# Patient Record
Sex: Male | Born: 1950
Health system: Southern US, Community
[De-identification: ages and names within clinical notes are randomized; demographics above are authoritative.]

## PROBLEM LIST (undated history)

## (undated) DIAGNOSIS — K219 Gastro-esophageal reflux disease without esophagitis: Secondary | ICD-10-CM

## (undated) DIAGNOSIS — I10 Essential (primary) hypertension: Secondary | ICD-10-CM

## (undated) DIAGNOSIS — E118 Type 2 diabetes mellitus with unspecified complications: Secondary | ICD-10-CM

## (undated) HISTORY — DX: Type 2 diabetes mellitus with unspecified complications: E11.8

---

## 2003-05-13 ENCOUNTER — Encounter: Admission: RE | Admit: 2003-05-13 | Discharge: 2003-05-13 | Payer: Self-pay | Admitting: Family Medicine

## 2003-09-27 ENCOUNTER — Encounter: Admission: RE | Admit: 2003-09-27 | Discharge: 2003-09-27 | Payer: Self-pay | Admitting: Family Medicine

## 2010-01-28 ENCOUNTER — Encounter: Payer: Self-pay | Admitting: Family Medicine

## 2012-10-12 ENCOUNTER — Ambulatory Visit (INDEPENDENT_AMBULATORY_CARE_PROVIDER_SITE_OTHER): Payer: BC Managed Care – PPO | Admitting: Physician Assistant

## 2012-10-12 ENCOUNTER — Encounter: Payer: Self-pay | Admitting: Physician Assistant

## 2012-10-12 VITALS — BP 126/88 | HR 80 | Temp 98.3°F | Resp 18 | Ht 64.0 in | Wt 166.0 lb

## 2012-10-12 DIAGNOSIS — L03011 Cellulitis of right finger: Secondary | ICD-10-CM

## 2012-10-12 DIAGNOSIS — IMO0002 Reserved for concepts with insufficient information to code with codable children: Secondary | ICD-10-CM

## 2012-10-12 MED ORDER — HYDROCODONE-ACETAMINOPHEN 5-325 MG PO TABS: 1.0000 | ORAL_TABLET | Freq: Four times a day (QID) | ORAL | Status: DC | PRN

## 2012-10-12 MED ORDER — DICLOXACILLIN SODIUM 500 MG PO CAPS: 500.0000 mg | ORAL_CAPSULE | Freq: Four times a day (QID) | ORAL | Status: DC

## 2012-10-12 NOTE — Progress Notes (Signed)
Patient ID: Cristian Austin MRN: [id number], DOB: [birth year], 62 y.o. Date of Encounter: 10/12/2012, 4:13 PM    Chief Complaint:  Chief Complaint  Patient presents with  . infection rt thumb     HPI: 62 y.o. year old AA male is the husband of one of my patients Cristian Austin. He states that about one week ago he been trimming some hedges but did not notice getting any pricks to his scan at that time. However it was around that same time he noticed some pain in his right thumb. However at that time it looked normal on inspection. The following day he really didn't notice much pain. However since then he has been developing some throbbing type pain and changes in the skin around the nail of the right thumb.  He reports he has never had any skin infections including any abscess/boil. And again he is unaware of any pricks or wounds to the skin.     Home Meds: See attached medication section for any medications that were entered at today's visit. The computer does not put those onto this list.The following list is a list of meds entered prior to today's visit.   No current outpatient prescriptions on file prior to visit.   No current facility-administered medications on file prior to visit.    Allergies:  Allergies  Allergen Reactions  . Asa [Aspirin]     GI upset      Review of Systems: See HPI for pertinent ROS. All other ROS negative.    Physical Exam: Blood pressure 126/88, pulse 80, temperature 98.3 F (36.8 C), temperature source Oral, resp. rate 18, height 5\' 4"  (1.626 m), weight 166 lb (75.297 kg)., Body mass index is 28.48 kg/(m^2). General: Well-nourished well-developed after American male. Appears in no acute distress. Lungs: Clear bilaterally to auscultation without wheezes, rales, or rhonchi. Breathing is unlabored. Heart: Regular rhythm. No murmurs, rubs, or gallops. Msk:  Strength and tone normal for age. Extremities/Skin: Right thumb: Along the medial edge of the  nail, there is approximate 3 mm area of golden color under medial edge of the nail. Adjacent to this, at the level of the skin along the medial edge of the nail, there is approx 3mm area of golden color just below the skin surface. Around the periphery of this, is an approximate 3 mm area of white coloration and is slightly blistered up. Neuro: Alert and oriented X 3. Moves all extremities spontaneously. Gait is normal. CNII-XII grossly in tact. Psych:  Responds to questions appropriately with a normal affect.     ASSESSMENT AND PLAN:  62 y.o. year old male with  1. Paronychia, right The area of the skin just next to the medial edge of the nail, I performed a very superficial incision with a sharp scalpel. Thick purulent drainage was obtained and sent for culture. I then palpated the area, getting out all of the purulent drainage. Start Empiric antibiotics of dicloxacillin. We'll give medication for pain. We'll followup culture results. Keep the site clean and dry. Followup if worsens or does not resolve or if develops fever/chills . - dicloxacillin (DYNAPEN) 500 MG capsule; Take 1 capsule (500 mg total) by mouth 4 (four) times daily.  Dispense: 40 capsule; Refill: 0 - HYDROcodone-acetaminophen (NORCO/VICODIN) 5-325 MG per tablet; Take 1 tablet by mouth every 6 (six) hours as needed for pain.  Dispense: 30 tablet; Refill: 0 - Culture, routine-abscess   Signed, [address] [place], [place], [facility] 10/12/2012 4:13 PM

## 2012-10-13 ENCOUNTER — Telehealth: Payer: Self-pay | Admitting: Family Medicine

## 2012-10-13 NOTE — Telephone Encounter (Signed)
Switch patient to bactrim ds po bid to cover mrsa for 10 days.

## 2012-10-13 NOTE — Telephone Encounter (Signed)
Patient was prescribed an antibiotic yesterday and went to fill it and they told her that they didn't have it and he needs it and she said that they called everywhere and no one has it in stock?

## 2012-10-13 NOTE — Telephone Encounter (Signed)
Called pt and the pharmacy got the original antibiotic in today around 12 so he does not need the Bactrim.

## 2012-10-16 LAB — CULTURE, ROUTINE-ABSCESS

## 2012-10-21 ENCOUNTER — Telehealth: Payer: Self-pay | Admitting: Physician Assistant

## 2012-10-21 NOTE — Telephone Encounter (Signed)
Patient came in with an infection to his Lt Thumb. Patient called in today to find out if we could see him tomorrow. All we have is Same day appointment. His thumb is still somewhat infected. He has pill left. Dicloxacillin 500 mg.

## 2012-10-22 NOTE — Telephone Encounter (Signed)
I just got a chance to see this message. Once I did read it, I called the patient at the phone number listed on the message which is his home number and also called what is listed in the computer as his work number. These are the only numbers that we have in his file. I was unable to contact the patient. I was going to followup with him to find out exactly how much antibiotic he has remaining and find out exactly how much his finger has improved.I will  Continue to try to contact patient.

## 2012-10-22 NOTE — Telephone Encounter (Signed)
Pt has two pills left.  Has puss pocket forming again under nail.  NTBS.  Told to come in tomorrow

## 2012-10-23 ENCOUNTER — Encounter: Payer: Self-pay | Admitting: Family Medicine

## 2012-10-23 ENCOUNTER — Ambulatory Visit (INDEPENDENT_AMBULATORY_CARE_PROVIDER_SITE_OTHER): Payer: BC Managed Care – PPO | Admitting: Family Medicine

## 2012-10-23 VITALS — BP 114/78 | HR 68 | Temp 98.1°F | Resp 18 | Wt 162.0 lb

## 2012-10-23 DIAGNOSIS — L03019 Cellulitis of unspecified finger: Secondary | ICD-10-CM

## 2012-10-23 DIAGNOSIS — L03011 Cellulitis of right finger: Secondary | ICD-10-CM

## 2012-10-23 MED ORDER — SULFAMETHOXAZOLE-TMP DS 800-160 MG PO TABS: 1.0000 | ORAL_TABLET | Freq: Two times a day (BID) | ORAL | Status: DC

## 2012-10-23 NOTE — Progress Notes (Signed)
  Subjective:    Patient ID: Cristian Austin, male    DOB: Jan 07, 1951, 62 y.o.   MRN: [id number]  HPI Was recently diagnosed with paronychia on his right thumb on the lateral margin of the fingernail.  Patient was started appropriately on dicloxacillin. After soaking his finger in warm Epson salt it drained purulent material from the perineal yet and from underneath the fingernail. There is no more fluctuance. There is no fluid collection to be drained today. The area is still erythematous and tender. There is mild desquamation of the overlying skin.  He has 2 children who have a history of MRSA infection and boils. No past medical history on file. Current Outpatient Prescriptions on File Prior to Visit  Medication Sig Dispense Refill  . omeprazole (PRILOSEC OTC) 20 MG tablet Take 20 mg by mouth daily.      . dicloxacillin (DYNAPEN) 500 MG capsule Take 1 capsule (500 mg total) by mouth 4 (four) times daily.  40 capsule  0  . HYDROcodone-acetaminophen (NORCO/VICODIN) 5-325 MG per tablet Take 1 tablet by mouth every 6 (six) hours as needed for pain.  30 tablet  0   No current facility-administered medications on file prior to visit.   Allergies  Allergen Reactions  . Asa [Aspirin]     GI upset   History   Social History  . Marital Status: Single    Spouse Name: N/A    Number of Children: N/A  . Years of Education: N/A   Occupational History  . Not on file.   Social History Main Topics  . Smoking status: Former Smoker    Quit date: 10/12/1997  . Smokeless tobacco: Never Used  . Alcohol Use: No  . Drug Use: No  . Sexual Activity: Not on file   Other Topics Concern  . Not on file   Social History Narrative  . No narrative on file      Review of Systems  All other systems reviewed and are negative.       Objective:   Physical Exam  Vitals reviewed. Cardiovascular: Normal rate and regular rhythm.   Pulmonary/Chest: Effort normal and breath sounds normal.  Skin: There  is erythema.   right thumb lateral nail margin and surrounding skin is erythematous and tender. It has spontaneously drained purulent material. There is no more fluid to be drained. There is no fluid available for collection and culture.        Assessment & Plan:  1. Paronychia of finger, right I switched the patient to Bactrim double strength 1 tablet by mouth twice a day for 7 days to cover MRSA. I recommended continuing to soak daily in warm Epsom salts. Recheck in 1 week if no better or sooner if worse. - sulfamethoxazole-trimethoprim (BACTRIM DS) 800-160 MG per tablet; Take 1 tablet by mouth 2 (two) times daily.  Dispense: 14 tablet; Refill: 0

## 2013-01-22 ENCOUNTER — Ambulatory Visit (INDEPENDENT_AMBULATORY_CARE_PROVIDER_SITE_OTHER): Payer: BC Managed Care – PPO | Admitting: Family Medicine

## 2013-01-22 ENCOUNTER — Encounter: Payer: Self-pay | Admitting: Family Medicine

## 2013-01-22 VITALS — BP 142/90 | HR 80 | Temp 97.6°F | Resp 18 | Ht 65.0 in | Wt 172.0 lb

## 2013-01-22 DIAGNOSIS — K611 Rectal abscess: Secondary | ICD-10-CM

## 2013-01-22 DIAGNOSIS — K612 Anorectal abscess: Secondary | ICD-10-CM

## 2013-01-22 MED ORDER — SULFAMETHOXAZOLE-TMP DS 800-160 MG PO TABS: 1.0000 | ORAL_TABLET | Freq: Two times a day (BID) | ORAL | Status: DC

## 2013-01-22 NOTE — Progress Notes (Signed)
   Subjective:    Patient ID: Cristian Austin, male    DOB: 1950/03/23, 63 y.o.   MRN: [id number]  HPI  Patient has had a boil near his rectum one-week.  The area is approximately 1 cm is erythematous tender and swollen.  Fortunately it is not fluctuant. It appears to be more of an indurated cellulitis than abscess. He is here today for medical therapy. No past medical history on file. Current Outpatient Prescriptions on File Prior to Visit  Medication Sig Dispense Refill  . omeprazole (PRILOSEC OTC) 20 MG tablet Take 20 mg by mouth daily.       No current facility-administered medications on file prior to visit.   Allergies  Allergen Reactions  . Asa [Aspirin]     GI upset   History   Social History  . Marital Status: Single    Spouse Name: N/A    Number of Children: N/A  . Years of Education: N/A   Occupational History  . Not on file.   Social History Main Topics  . Smoking status: Former Smoker    Quit date: 10/12/1997  . Smokeless tobacco: Never Used  . Alcohol Use: No  . Drug Use: No  . Sexual Activity: Not on file   Other Topics Concern  . Not on file   Social History Narrative  . No narrative on file     Review of Systems  All other systems reviewed and are negative.       Objective:   Physical Exam  Vitals reviewed. Cardiovascular: Normal rate and regular rhythm.   Pulmonary/Chest: Effort normal and breath sounds normal.  Skin: Skin is warm. There is erythema.   neorectum on his left gluteus there is a 1 cm erythematous tender indurated area. It is not fluctuant. There is no appreciable fluid that can be drained through an I+D.        Assessment & Plan:  1. Perirectal abscess Begin Bactrim double strength one by mouth twice a day for 7 days. I recommended soaking in Epsom salts twice a day. If the area becomes fluctuant or worsens he needs to return immediately for an I&D.  I did not want to perform an I&D today he 2 reasons. One there is not an  appreciable collection of fluid that they would benefit from incision and drainage. #2 the area is in such a bad area that I think it would be susceptible to superinfection. - sulfamethoxazole-trimethoprim (BACTRIM DS) 800-160 MG per tablet; Take 1 tablet by mouth 2 (two) times daily.  Dispense: 14 tablet; Refill: 0

## 2013-03-30 ENCOUNTER — Encounter: Payer: Self-pay | Admitting: Family Medicine

## 2013-03-30 ENCOUNTER — Ambulatory Visit (INDEPENDENT_AMBULATORY_CARE_PROVIDER_SITE_OTHER): Payer: BC Managed Care – PPO | Admitting: Family Medicine

## 2013-03-30 VITALS — BP 130/84 | HR 84 | Temp 98.5°F | Resp 18 | Ht 65.0 in | Wt 173.0 lb

## 2013-03-30 DIAGNOSIS — K219 Gastro-esophageal reflux disease without esophagitis: Secondary | ICD-10-CM

## 2013-03-30 MED ORDER — OMEPRAZOLE 40 MG PO CPDR: 40.0000 mg | DELAYED_RELEASE_CAPSULE | Freq: Every day | ORAL | Status: DC

## 2013-03-30 MED ORDER — CELECOXIB 200 MG PO CAPS: 200.0000 mg | ORAL_CAPSULE | Freq: Two times a day (BID) | ORAL | Status: DC

## 2013-03-30 NOTE — Progress Notes (Signed)
   Subjective:    Patient ID: Pricilla Larssononald Kirn, male    DOB: December 13, 1950, 63 y.o.   MRN: [id number]  HPI Patient reports indigestion. On a daily basis he has a burning sensation starting in his stomach and radiate up underneath the sternum into the back of his throat.  It is worse when he eats spicy foods or when he lies down. He denies any melena or hematochezia. He denies any chest pain or shortness of breath or angina. He denies any weight loss fevers or chills. Symptoms improved on over-the-counter omeprazole.  Symptoms have been present for several weeks. He is also having pain in both shoulders with overhead activities. He is interested in taking anti-inflammatory drugs for this. No past medical history on file. Current Outpatient Prescriptions on File Prior to Visit  Medication Sig Dispense Refill  . omeprazole (PRILOSEC OTC) 20 MG tablet Take 20 mg by mouth daily.       No current facility-administered medications on file prior to visit.   Allergies  Allergen Reactions  . Asa [Aspirin]     GI upset   History   Social History  . Marital Status: Single    Spouse Name: N/A    Number of Children: N/A  . Years of Education: N/A   Occupational History  . Not on file.   Social History Main Topics  . Smoking status: Former Smoker    Quit date: 10/12/1997  . Smokeless tobacco: Never Used  . Alcohol Use: No  . Drug Use: No  . Sexual Activity: Not on file   Other Topics Concern  . Not on file   Social History Narrative  . No narrative on file      Review of Systems  All other systems reviewed and are negative.       Objective:   Physical Exam  Vitals reviewed. Cardiovascular: Normal rate, regular rhythm and normal heart sounds.  Exam reveals no gallop and no friction rub.   No murmur heard. Pulmonary/Chest: Effort normal and breath sounds normal. No respiratory distress. He has no wheezes. He has no rales.  Abdominal: Soft. Bowel sounds are normal. He exhibits no  distension. There is no tenderness. There is no rebound and no guarding.          Assessment & Plan:  1. GERD (gastroesophageal reflux disease) Begin omeprazole 40 mg by mouth daily. Given the patient's history of GI upset on aspirin I recommended that he not take any anti-inflammatories at the present time for his shoulder pain until his stomach problems improve on the omeprazole.  Afterwards the patient can begin Celebrex 200 mg by mouth daily when necessary. - omeprazole (PRILOSEC) 40 MG capsule; Take 1 capsule (40 mg total) by mouth daily.  Dispense: 90 capsule; Refill: 3

## 2013-05-26 ENCOUNTER — Ambulatory Visit (INDEPENDENT_AMBULATORY_CARE_PROVIDER_SITE_OTHER): Payer: BC Managed Care – PPO | Admitting: Family Medicine

## 2013-05-26 ENCOUNTER — Encounter: Payer: Self-pay | Admitting: Family Medicine

## 2013-05-26 VITALS — BP 136/82 | HR 64 | Temp 97.3°F | Resp 14 | Ht 67.0 in | Wt 171.0 lb

## 2013-05-26 DIAGNOSIS — L02419 Cutaneous abscess of limb, unspecified: Secondary | ICD-10-CM

## 2013-05-26 DIAGNOSIS — L03119 Cellulitis of unspecified part of limb: Principal | ICD-10-CM

## 2013-05-26 MED ORDER — SULFAMETHOXAZOLE-TMP DS 800-160 MG PO TABS: 1.0000 | ORAL_TABLET | Freq: Two times a day (BID) | ORAL | Status: DC

## 2013-05-26 MED ORDER — OMEPRAZOLE 20 MG PO CPDR: 20.0000 mg | DELAYED_RELEASE_CAPSULE | Freq: Every day | ORAL | Status: DC

## 2013-05-26 NOTE — Progress Notes (Signed)
Patient ID: Cristian LarssonRonald Austin, male   DOB: 1950-12-20, 63 y.o.   MRN: [id number]   Subjective:    Patient ID: Cristian Larssononald Suchy, male    DOB: 1950-12-20, 63 y.o.   MRN: [id number]  Patient presents for hard knot to R inner knee  patient here with swelling on his right inner knee for the past week. States it started out as a small little bump he tries to squeeze it he did get a little bit of clear fluid out there was tenderness all around and some redness but now he just has a tender spot in the middle and scab. He denies any actual knee pain no fever no chills. No over-the-counter medications taken.  He also requests a refill of his Prilosec 20 mg    Review Of Systems:  GEN- denies fatigue, fever, weight loss,weakness, recent illness HEENT- denies eye drainage, change in vision, nasal discharge, CVS- denies chest pain, palpitations RESP- denies SOB, cough, wheeze MSK- denies joint pain, muscle aches, injury        Objective:    BP 136/82  Pulse 64  Temp(Src) 97.3 F (36.3 C) (Oral)  Resp 14  Ht 5\' 7"  (1.702 m)  Wt 171 lb (77.565 kg)  BMI 26.78 kg/m2 GEN- NAD, alert and oriented x3 MSK- right knee normal inspection , FROM, no crepitus, no effusion, no warmth Skin- small abscess size of quarter 1cm below patella and medially, mild erythema surrounding, fluctuant in center, mild TTP       Assessment & Plan:      Problem List Items Addressed This Visit   None    Visit Diagnoses   Cellulitis and abscess of leg    -  Primary    Small boil on his leg with minimal cellulitic changes. We discussed incision and drainage however he declined today as he has to work the next 2 days. Start Bactrim, f/u Friday if not improved and I and D will be done       Note: This dictation was prepared with Dragon dictation along with smaller phrase technology. Any transcriptional errors that result from this process are unintentional.

## 2013-05-26 NOTE — Patient Instructions (Signed)
Use warm compresses on knee Start antibiotics Come back Friday if not better for recheck

## 2014-03-25 ENCOUNTER — Other Ambulatory Visit: Payer: Self-pay | Admitting: Family Medicine

## 2014-03-25 NOTE — Telephone Encounter (Signed)
Refill appropriate and filled per protocol. 

## 2014-09-02 ENCOUNTER — Encounter: Payer: Self-pay | Admitting: Family Medicine

## 2014-09-02 ENCOUNTER — Ambulatory Visit (INDEPENDENT_AMBULATORY_CARE_PROVIDER_SITE_OTHER): Payer: BC Managed Care – PPO | Admitting: Family Medicine

## 2014-09-02 VITALS — BP 130/90 | HR 86 | Temp 98.2°F | Resp 16 | Ht 65.0 in | Wt 173.0 lb

## 2014-09-02 DIAGNOSIS — J069 Acute upper respiratory infection, unspecified: Secondary | ICD-10-CM | POA: Diagnosis not present

## 2014-09-02 MED ORDER — GUAIFENESIN-CODEINE 100-10 MG/5ML PO SOLN: 10.0000 mL | Freq: Four times a day (QID) | ORAL | Status: DC | PRN

## 2014-09-02 NOTE — Progress Notes (Signed)
   Subjective:    Patient ID: Cristian Austin, male    DOB: Feb 03, 1950, 64 y.o.   MRN: [id number]  HPI  Patient reports a one-week history of cough. He acquired the infection from his coworker. He denies any fever. He denies any shortness of breath. He denies any chest pain or pleurisy. He denies any hemoptysis. The cough is productive of clear sputum. He denies any rhinorrhea or sore throat. The cough is mainly a tickle sensation in the back of his throat. No past medical history on file. No past surgical history on file. Current Outpatient Prescriptions on File Prior to Visit  Medication Sig Dispense Refill  . omeprazole (PRILOSEC) 20 MG capsule TAKE (1) CAPSULE BY MOUTH ONCE DAILY. 30 capsule 11   No current facility-administered medications on file prior to visit.   Allergies  Allergen Reactions  . Asa [Aspirin]     GI upset   Social History   Social History  . Marital Status: Single    Spouse Name: N/A  . Number of Children: N/A  . Years of Education: N/A   Occupational History  . Not on file.   Social History Main Topics  . Smoking status: Former Smoker    Quit date: 10/12/1997  . Smokeless tobacco: Never Used  . Alcohol Use: No  . Drug Use: No  . Sexual Activity: Not on file   Other Topics Concern  . Not on file   Social History Narrative     Review of Systems  All other systems reviewed and are negative.      Objective:   Physical Exam  Constitutional: He appears well-developed and well-nourished. No distress.  HENT:  Right Ear: External ear normal.  Left Ear: External ear normal.  Nose: Nose normal.  Mouth/Throat: Oropharynx is clear and moist. No oropharyngeal exudate.  Eyes: Conjunctivae are normal.  Neck: Neck supple.  Cardiovascular: Normal rate, regular rhythm and normal heart sounds.   Pulmonary/Chest: Effort normal and breath sounds normal. No respiratory distress. He has no wheezes. He has no rales. He exhibits no tenderness.    Lymphadenopathy:    He has no cervical adenopathy.  Skin: He is not diaphoretic.  Vitals reviewed.         Assessment & Plan:  Acute URI - Plan: guaiFENesin-codeine 100-10 MG/5ML syrup  I think the patient has a mild viral upper respiratory infection or even viral bronchitis. However he states that it is slowly improving throughout the week. I recommended tincture of time and Robitussin with codeine 2 teaspoons every 6 hours as needed for cough. I also gave him samples of Viagra 100 mg, 1/2-1 tablet per day as needed for erectile dysfunction per his request

## 2014-09-09 ENCOUNTER — Telehealth: Payer: Self-pay | Admitting: Family Medicine

## 2014-09-09 MED ORDER — OMEPRAZOLE 20 MG PO CPDR: DELAYED_RELEASE_CAPSULE | ORAL | Status: DC

## 2014-09-09 NOTE — Telephone Encounter (Signed)
Patient calling in a request for his prilosec 20 mg he would like this called into [place].

## 2014-09-09 NOTE — Telephone Encounter (Signed)
Medication called/sent to requested pharmacy  

## 2014-10-24 ENCOUNTER — Ambulatory Visit (INDEPENDENT_AMBULATORY_CARE_PROVIDER_SITE_OTHER): Payer: BLUE CROSS/BLUE SHIELD | Admitting: Family Medicine

## 2014-10-24 ENCOUNTER — Encounter: Payer: Self-pay | Admitting: Family Medicine

## 2014-10-24 VITALS — BP 130/96 | HR 94 | Temp 98.6°F | Resp 16 | Ht 65.0 in | Wt 173.0 lb

## 2014-10-24 DIAGNOSIS — J019 Acute sinusitis, unspecified: Secondary | ICD-10-CM | POA: Diagnosis not present

## 2014-10-24 MED ORDER — SILDENAFIL CITRATE 100 MG PO TABS: 50.0000 mg | ORAL_TABLET | Freq: Every day | ORAL | Status: DC | PRN

## 2014-10-24 MED ORDER — AMOXICILLIN 875 MG PO TABS: 875.0000 mg | ORAL_TABLET | Freq: Two times a day (BID) | ORAL | Status: DC

## 2014-10-24 NOTE — Progress Notes (Signed)
   Subjective:    Patient ID: Cristian Austin, male    DOB: July 24, 1950, 64 y.o.   MRN: [id number]  HPI  patient has had 4 days of symptoms. He reports pain in his right maxillary sinus. He has pain in his upper right molars. He has headaches. He is unable to breathe through his right sinus. He is tried sinus medication, Flonase, nasal saline without relief. He reports dull headache. He reports postnasal drip No past medical history on file. No past surgical history on file. Current Outpatient Prescriptions on File Prior to Visit  Medication Sig Dispense Refill  . omeprazole (PRILOSEC) 20 MG capsule TAKE (1) CAPSULE BY MOUTH ONCE DAILY. 30 capsule 11   No current facility-administered medications on file prior to visit.   Allergies  Allergen Reactions  . Asa [Aspirin]     GI upset   Social History   Social History  . Marital Status: Single    Spouse Name: N/A  . Number of Children: N/A  . Years of Education: N/A   Occupational History  . Not on file.   Social History Main Topics  . Smoking status: Former Smoker    Quit date: 10/12/1997  . Smokeless tobacco: Never Used  . Alcohol Use: No  . Drug Use: No  . Sexual Activity: Not on file   Other Topics Concern  . Not on file   Social History Narrative      Review of Systems  All other systems reviewed and are negative.      Objective:   Physical Exam  Constitutional: He appears well-developed and well-nourished. No distress.  HENT:  Head: Normocephalic and atraumatic.  Right Ear: Tympanic membrane, external ear and ear canal normal.  Left Ear: Tympanic membrane, external ear and ear canal normal.  Nose: Mucosal edema and rhinorrhea present. Right sinus exhibits maxillary sinus tenderness. Right sinus exhibits no frontal sinus tenderness. Left sinus exhibits no maxillary sinus tenderness and no frontal sinus tenderness.  Mouth/Throat: Oropharynx is clear and moist. No oropharyngeal exudate.  Neck: Neck supple.    Cardiovascular: Normal rate, regular rhythm and normal heart sounds.   Pulmonary/Chest: Effort normal and breath sounds normal.  Lymphadenopathy:    He has no cervical adenopathy.  Skin: He is not diaphoretic.  Vitals reviewed.         Assessment & Plan:  Acute rhinosinusitis - Plan: amoxicillin (AMOXIL) 875 MG tablet   Begin amoxicillin 875 mg by mouth twice a day for 10 days. I also refilled the patient's Viagra as requested

## 2014-11-09 ENCOUNTER — Telehealth: Payer: Self-pay | Admitting: Family Medicine

## 2014-11-09 NOTE — Telephone Encounter (Signed)
Wife calling to say that Cristian Austin is still coughing and has a lot of "stuff" going on, could another antibiotic be called in  [facility]  [phone]

## 2014-11-10 MED ORDER — LEVOFLOXACIN 500 MG PO TABS: 500.0000 mg | ORAL_TABLET | Freq: Every day | ORAL | Status: DC

## 2014-11-10 NOTE — Telephone Encounter (Addendum)
RX to pharmacy.  Pt made aware 

## 2014-11-10 NOTE — Telephone Encounter (Signed)
Switch to levaquin 500 qday for 7 days.

## 2014-11-21 ENCOUNTER — Encounter (HOSPITAL_COMMUNITY): Payer: Self-pay | Admitting: *Deleted

## 2014-11-21 ENCOUNTER — Emergency Department (HOSPITAL_COMMUNITY)
Admission: EM | Admit: 2014-11-21 | Discharge: 2014-11-21 | Disposition: A | Discharge status: Home / Self Care | Payer: BLUE CROSS/BLUE SHIELD | Attending: Emergency Medicine | Admitting: Emergency Medicine

## 2014-11-21 DIAGNOSIS — K219 Gastro-esophageal reflux disease without esophagitis: Secondary | ICD-10-CM | POA: Diagnosis not present

## 2014-11-21 DIAGNOSIS — Z79899 Other long term (current) drug therapy: Secondary | ICD-10-CM | POA: Insufficient documentation

## 2014-11-21 DIAGNOSIS — R21 Rash and other nonspecific skin eruption: Secondary | ICD-10-CM | POA: Diagnosis present

## 2014-11-21 DIAGNOSIS — L309 Dermatitis, unspecified: Secondary | ICD-10-CM | POA: Diagnosis not present

## 2014-11-21 HISTORY — DX: Gastro-esophageal reflux disease without esophagitis: K21.9

## 2014-11-21 LAB — CBC WITH DIFFERENTIAL/PLATELET
BASOS ABS: 0 10*3/uL (ref 0.0–0.1)
BASOS PCT: 0 %
EOS ABS: 1.1 10*3/uL — AB (ref 0.0–0.7)
Eosinophils Relative: 11 %
HCT: 45.9 % (ref 39.0–52.0)
HEMOGLOBIN: 15.1 g/dL (ref 13.0–17.0)
LYMPHS ABS: 3.4 10*3/uL (ref 0.7–4.0)
Lymphocytes Relative: 34 %
MCH: 28.3 pg (ref 26.0–34.0)
MCHC: 32.9 g/dL (ref 30.0–36.0)
MCV: 86 fL (ref 78.0–100.0)
Monocytes Absolute: 0.8 10*3/uL (ref 0.1–1.0)
Monocytes Relative: 8 %
NEUTROS PCT: 47 %
Neutro Abs: 4.6 10*3/uL (ref 1.7–7.7)
PLATELETS: 304 10*3/uL (ref 150–400)
RBC: 5.34 MIL/uL (ref 4.22–5.81)
RDW: 14.9 % (ref 11.5–15.5)
WBC: 9.9 10*3/uL (ref 4.0–10.5)

## 2014-11-21 LAB — COMPREHENSIVE METABOLIC PANEL
ALBUMIN: 3.7 g/dL (ref 3.5–5.0)
ALK PHOS: 67 U/L (ref 38–126)
ALT: 20 U/L (ref 17–63)
AST: 28 U/L (ref 15–41)
Anion gap: 7 (ref 5–15)
BUN: 23 mg/dL — ABNORMAL HIGH (ref 6–20)
CALCIUM: 9 mg/dL (ref 8.9–10.3)
CHLORIDE: 106 mmol/L (ref 101–111)
CO2: 27 mmol/L (ref 22–32)
CREATININE: 1.11 mg/dL (ref 0.61–1.24)
GFR calc Af Amer: >60 mL/min (ref >60)
GFR calc non Af Amer: >60 mL/min (ref >60)
GLUCOSE: 129 mg/dL — AB (ref 65–99)
Potassium: 4 mmol/L (ref 3.5–5.1)
SODIUM: 140 mmol/L (ref 135–145)
Total Bilirubin: 0.7 mg/dL (ref 0.3–1.2)
Total Protein: 6.4 g/dL — ABNORMAL LOW (ref 6.5–8.1)

## 2014-11-21 MED ORDER — FAMOTIDINE 20 MG PO TABS: 20.0000 mg | ORAL_TABLET | Freq: Two times a day (BID) | ORAL | Status: DC

## 2014-11-21 MED ORDER — FAMOTIDINE IN NACL 20-0.9 MG/50ML-% IV SOLN
20.0000 mg | Freq: Once | INTRAVENOUS | Status: AC
  Administered 2014-11-21: 20 mg via INTRAVENOUS
  Filled 2014-11-21: qty 50

## 2014-11-21 MED ORDER — METHYLPREDNISOLONE SODIUM SUCC 125 MG IJ SOLR
125.0000 mg | Freq: Once | INTRAMUSCULAR | Status: AC
  Administered 2014-11-21: 125 mg via INTRAVENOUS
  Filled 2014-11-21: qty 2

## 2014-11-21 MED ORDER — DIPHENHYDRAMINE HCL 50 MG/ML IJ SOLN
25.0000 mg | Freq: Once | INTRAMUSCULAR | Status: AC
  Administered 2014-11-21: 25 mg via INTRAVENOUS
  Filled 2014-11-21: qty 1

## 2014-11-21 MED ORDER — PREDNISONE 10 MG PO TABS: 20.0000 mg | ORAL_TABLET | Freq: Every day | ORAL | Status: DC

## 2014-11-21 NOTE — ED Notes (Addendum)
Pt c/o itching and rash all over body after mowing the yard Friday. Denies trouble breathing. States he feels his hands are swollen. Patient skin is red and pt still c/o of itching.

## 2014-11-21 NOTE — ED Notes (Signed)
Pt states that his itching is much better at this time.

## 2014-11-21 NOTE — ED Provider Notes (Signed)
CSN: [id number]     Arrival date & time 11/21/14  [id number] History  By signing my name below, I, Cristian Austin, attest that this documentation has been prepared under the direction and in the presence of [name], MD. Electronically Signed: [name], ED Scribe. 11/21/2014. 11:41 AM.   Chief Complaint  Patient presents with  . Rash      Patient is a 64 y.o. male presenting with rash. The history is provided by the patient (the patient). No language interpreter was used.  Rash Location:  Torso Torso rash location:  R chest, L chest, upper back, lower back, abd LLQ, abd LUQ, abd RUQ and abd RLQ Quality: itchiness and redness   Severity:  Moderate Onset quality:  Sudden Duration:  4 days Timing:  Constant Progression:  Unchanged Chronicity:  New Context: not new detergent/soap   Context comment:  Cutting grass Relieved by:  None tried Worsened by:  Nothing tried Ineffective treatments:  None tried Associated symptoms: no abdominal pain, no diarrhea, no fatigue and no headaches     Pricilla LarssonRonald Austin is a 64 y.o. male who presents to the Emergency Department complaining of itchy, red, generalized rash onset 4 days. Pt notes that he was cutting the grass 4 days ago and following he began to have a itchy non-painful rash. He denies this happening in the past. Pt denies new soaps/medications/pets/environment/lotion/detergent. Pt is having associated symptoms of color change, bilateral hand swelling, and bilateral ear swelling. He notes that he has not tried any medications for the relief of his symptoms. He denies any other symptoms. Pt is not allergic to any medications. Pt denies allergies to any medications. He notes that he takes daily medications for his acid reflux.   Past Medical History  Diagnosis Date  . GERD (gastroesophageal reflux disease)    History reviewed. No pertinent past surgical history. History reviewed. No pertinent family history. Social History  Substance Use  Topics  . Smoking status: Former Smoker    Quit date: 10/12/1997  . Smokeless tobacco: Never Used  . Alcohol Use: No    Review of Systems  Constitutional: Negative for appetite change and fatigue.  HENT: Negative for congestion, ear discharge and sinus pressure.   Eyes: Negative for discharge.  Respiratory: Negative for cough.   Cardiovascular: Negative for chest pain.  Gastrointestinal: Negative for abdominal pain and diarrhea.  Genitourinary: Negative for frequency and hematuria.  Musculoskeletal: Positive for joint swelling. Negative for back pain.  Skin: Positive for color change and rash.  Neurological: Negative for seizures and headaches.  Psychiatric/Behavioral: Negative for hallucinations.      Allergies  Asa  Home Medications   Prior to Admission medications   Medication Sig Start Date End Date Taking? Authorizing Provider  ibuprofen (ADVIL,MOTRIN) 200 MG tablet Take 400 mg by mouth every 6 (six) hours as needed for moderate pain.   Yes Historical Provider, MD  omeprazole (PRILOSEC) 20 MG capsule TAKE (1) CAPSULE BY MOUTH ONCE DAILY. 09/09/14  Yes Donita BrooksWarren T Pickard, MD  sildenafil (VIAGRA) 100 MG tablet Take 0.5-1 tablets (50-100 mg total) by mouth daily as needed for erectile dysfunction. 10/24/14  Yes Donita BrooksWarren T Pickard, MD  amoxicillin (AMOXIL) 875 MG tablet Take 1 tablet (875 mg total) by mouth 2 (two) times daily. Patient not taking: Reported on 11/21/2014 10/24/14   Donita BrooksWarren T Pickard, MD  levofloxacin (LEVAQUIN) 500 MG tablet Take 1 tablet (500 mg total) by mouth daily. Patient not taking: Reported on 11/21/2014 11/10/14   Broadus JohnWarren  Flonnie Hailstone, MD   BP 99/61 mmHg  Pulse 122  Temp(Src) 97.8 F (36.6 C) (Oral)  Resp 20  Ht  (1.651 m)  Wt 174 lb (78.926 kg)  BMI 28.96 kg/m2  SpO2 100% Physical Exam  Constitutional: He is oriented to person, place, and time. He appears well-developed.  HENT:  Head: Normocephalic.  Eyes: Conjunctivae and EOM are normal. No  scleral icterus.  Neck: Neck supple. No thyromegaly present.  Cardiovascular: Normal rate and regular rhythm.  Exam reveals no gallop and no friction rub.   No murmur heard. Pulmonary/Chest: No stridor. He has no wheezes. He has no rales. He exhibits no tenderness.  Abdominal: He exhibits no distension. There is no tenderness. There is no rebound.  Musculoskeletal: Normal range of motion. He exhibits no edema.  Swelling to hands and both of his ears.   Lymphadenopathy:    He has no cervical adenopathy.  Neurological: He is oriented to person, place, and time. He exhibits normal muscle tone. Coordination normal.  Skin: Rash noted. No erythema.  Rash all over chest, back, abdomen.  Psychiatric: He has a normal mood and affect. His behavior is normal.  Nursing note and vitals reviewed.   ED Course  Procedures (including critical care time) DIAGNOSTIC STUDIES: Oxygen Saturation is 100% on RA, nl by my interpretation.    COORDINATION OF CARE: 11:35 AM Discussed treatment plan with pt at bedside which includes solu-medrol injection, benadryl injection, pepcid, and labs and pt agreed to plan.    Labs Review Labs Reviewed  CBC WITH DIFFERENTIAL/PLATELET  COMPREHENSIVE METABOLIC PANEL    Imaging Review No results found. I have personally reviewed and evaluated these lab results as part of my medical decision-making.   EKG Interpretation None      MDM   Final diagnoses:  None   Allergic dermatitis, improved with treatment in emergency department. We'll send patient home on prednisone Pepcid and Benadryl as needed he is to follow-up with his M.D. If needed   The chart was scribed for me under my direct supervision.  I personally performed the history, physical, and medical decision making and all procedures in the evaluation of this patient.Bethann Berkshire, MD 11/21/14 [phone]

## 2014-11-21 NOTE — Discharge Instructions (Signed)
Take benadryl every 6 hours for rash or itching and follow up with your md if not improving

## 2014-11-22 ENCOUNTER — Ambulatory Visit: Payer: BC Managed Care – PPO | Admitting: Family Medicine

## 2014-12-19 ENCOUNTER — Ambulatory Visit (INDEPENDENT_AMBULATORY_CARE_PROVIDER_SITE_OTHER): Payer: BLUE CROSS/BLUE SHIELD | Admitting: Family Medicine

## 2014-12-19 ENCOUNTER — Encounter: Payer: Self-pay | Admitting: Family Medicine

## 2014-12-19 VITALS — BP 130/90 | HR 88 | Temp 97.7°F | Resp 16 | Ht 65.0 in | Wt 174.0 lb

## 2014-12-19 DIAGNOSIS — L500 Allergic urticaria: Secondary | ICD-10-CM

## 2014-12-19 MED ORDER — TRIAMCINOLONE ACETONIDE 0.1 % EX CREA: 1.0000 "application " | TOPICAL_CREAM | Freq: Two times a day (BID) | CUTANEOUS | Status: DC

## 2014-12-19 NOTE — Progress Notes (Signed)
Subjective:    Patient ID: Cristian Austin, male    DOB: Mar 07, 1950, 64 y.o.   MRN: [id number]  HPI  patient reports recurrent urticaria all over his body. Rash began on his torso and then spread to his arms. He describes red papules , hives, and whelps. They will migrate from his chest down his arms down his legs and even between his toes. Seems to occur after he takes a shower. He denies any new soaps or shaving creams or detergents or colognes. Once was so bad he went to the emergency room and received Solu-Medrol, Pepcid, and Benadryl. He denies any angioedema or tongue swelling. Today he does have some small areas of erythema on his biceps and a few hives here.   He has been taking Benadryl as needed.Marland Kitchen. He is not taking any medication other than his omeprazole. He denies any change in his diet. He denies any topical exposures that he could be allergic to Past Medical History  Diagnosis Date  . GERD (gastroesophageal reflux disease)    No past surgical history on file. Current Outpatient Prescriptions on File Prior to Visit  Medication Sig Dispense Refill  . famotidine (PEPCID) 20 MG tablet Take 1 tablet (20 mg total) by mouth 2 (two) times daily. 10 tablet 0  . ibuprofen (ADVIL,MOTRIN) 200 MG tablet Take 400 mg by mouth every 6 (six) hours as needed for moderate pain.    Marland Kitchen. omeprazole (PRILOSEC) 20 MG capsule TAKE (1) CAPSULE BY MOUTH ONCE DAILY. 30 capsule 11  . sildenafil (VIAGRA) 100 MG tablet Take 0.5-1 tablets (50-100 mg total) by mouth daily as needed for erectile dysfunction. 5 tablet 11   No current facility-administered medications on file prior to visit.   Allergies  Allergen Reactions  . Asa [Aspirin]     GI upset   Social History   Social History  . Marital Status: Single    Spouse Name: N/A  . Number of Children: N/A  . Years of Education: N/A   Occupational History  . Not on file.   Social History Main Topics  . Smoking status: Former Smoker    Quit date:  10/12/1997  . Smokeless tobacco: Never Used  . Alcohol Use: No  . Drug Use: No  . Sexual Activity: Not on file   Other Topics Concern  . Not on file   Social History Narrative      Review of Systems  All other systems reviewed and are negative.      Objective:   Physical Exam  Constitutional: He appears well-developed and well-nourished. No distress.  HENT:  Nose: Nose normal.  Mouth/Throat: Oropharynx is clear and moist. No oropharyngeal exudate.  Eyes: Conjunctivae are normal.  Neck: Neck supple.  Cardiovascular: Normal rate, regular rhythm and normal heart sounds.   Pulmonary/Chest: Effort normal and breath sounds normal.  Abdominal: Soft. Bowel sounds are normal.  Lymphadenopathy:    He has no cervical adenopathy.  Skin: Rash noted. He is not diaphoretic. There is erythema.          Assessment & Plan:  Allergic urticaria - Plan: triamcinolone cream (KENALOG) 0.1 %, Ambulatory referral to Allergy   Given the recurrent nature of his symptoms, the migratory nature of the rash , patient appears to have allergic urticaria and I suspect a food allergy that has gone undiagnosed. Therefore I recommended that he discontinue antihistamines and see an allergist for allergy testing. He can use triamcinolone cream as needed until he sees the allergist.  I am concerned that he is taking antihistamines it may cloud his testing. Certainly if he develops a dangerous allergic reaction he knows take Benadryl and seek medical attention immediately.

## 2015-01-10 ENCOUNTER — Ambulatory Visit (INDEPENDENT_AMBULATORY_CARE_PROVIDER_SITE_OTHER): Payer: BLUE CROSS/BLUE SHIELD | Admitting: Family Medicine

## 2015-01-10 ENCOUNTER — Encounter: Payer: Self-pay | Admitting: Family Medicine

## 2015-01-10 VITALS — BP 132/88 | HR 84 | Temp 98.1°F | Resp 18 | Ht 65.0 in | Wt 171.0 lb

## 2015-01-10 DIAGNOSIS — L02419 Cutaneous abscess of limb, unspecified: Secondary | ICD-10-CM | POA: Diagnosis not present

## 2015-01-10 DIAGNOSIS — L03119 Cellulitis of unspecified part of limb: Secondary | ICD-10-CM

## 2015-01-10 MED ORDER — SULFAMETHOXAZOLE-TRIMETHOPRIM 800-160 MG PO TABS: 1.0000 | ORAL_TABLET | Freq: Two times a day (BID) | ORAL | Status: DC

## 2015-01-10 NOTE — Progress Notes (Signed)
   Subjective:    Patient ID: Cristian Austin, male    DOB: 1950-02-11, 65 y.o.   MRN: [id number]  HPI Boil on the dorsum of the right leg for 3-4 days.  Erythematous, warm, amd painful.  Redness is 4 cm in diameter.  Lysed spontaneously yesterday.  Expressed copious purulent material.  Also two small areas (1cm) on each forearm.  Has history of boils. Past Medical History  Diagnosis Date  . GERD (gastroesophageal reflux disease)    No past surgical history on file. Current Outpatient Prescriptions on File Prior to Visit  Medication Sig Dispense Refill  . famotidine (PEPCID) 20 MG tablet Take 1 tablet (20 mg total) by mouth 2 (two) times daily. 10 tablet 0  . ibuprofen (ADVIL,MOTRIN) 200 MG tablet Take 400 mg by mouth every 6 (six) hours as needed for moderate pain.    Marland Kitchen. omeprazole (PRILOSEC) 20 MG capsule TAKE (1) CAPSULE BY MOUTH ONCE DAILY. 30 capsule 11  . sildenafil (VIAGRA) 100 MG tablet Take 0.5-1 tablets (50-100 mg total) by mouth daily as needed for erectile dysfunction. 5 tablet 11  . triamcinolone cream (KENALOG) 0.1 % Apply 1 application topically 2 (two) times daily. 80 g 1   No current facility-administered medications on file prior to visit.   Allergies  Allergen Reactions  . Asa [Aspirin]     GI upset   Social History   Social History  . Marital Status: Single    Spouse Name: N/A  . Number of Children: N/A  . Years of Education: N/A   Occupational History  . Not on file.   Social History Main Topics  . Smoking status: Former Smoker    Quit date: 10/12/1997  . Smokeless tobacco: Never Used  . Alcohol Use: No  . Drug Use: No  . Sexual Activity: Not on file   Other Topics Concern  . Not on file   Social History Narrative      Review of Systems  All other systems reviewed and are negative.      Objective:   Physical Exam  Cardiovascular: Normal rate, regular rhythm and normal heart sounds.   Pulmonary/Chest: Effort normal and breath sounds normal.   Skin: There is erythema.  Vitals reviewed. See description in HPI.         Assessment & Plan:  Cellulitis and abscess of leg - Plan: sulfamethoxazole-trimethoprim (BACTRIM DS,SEPTRA DS) 800-160 MG tablet  Bactrim ds bid for 10 days.  Bath once weekly in bathtub full of water with capful of clorox.  Use dial soap.

## 2015-01-19 ENCOUNTER — Telehealth: Payer: Self-pay | Admitting: Family Medicine

## 2015-01-19 DIAGNOSIS — L03119 Cellulitis of unspecified part of limb: Principal | ICD-10-CM

## 2015-01-19 DIAGNOSIS — L02419 Cutaneous abscess of limb, unspecified: Secondary | ICD-10-CM

## 2015-01-19 NOTE — Telephone Encounter (Signed)
Patient was here to see dr pickard for his boils, his wife is calling to request 10 extra antibiotic for this issue if possible please call her at [phone] [facility]

## 2015-01-20 MED ORDER — SULFAMETHOXAZOLE-TRIMETHOPRIM 800-160 MG PO TABS: 1.0000 | ORAL_TABLET | Freq: Two times a day (BID) | ORAL | Status: DC

## 2015-01-20 NOTE — Telephone Encounter (Signed)
Can have an addition 5 days worth of tabs.

## 2015-01-20 NOTE — Telephone Encounter (Signed)
Pt's wife aware and med sent to pharm 

## 2015-01-24 ENCOUNTER — Ambulatory Visit (INDEPENDENT_AMBULATORY_CARE_PROVIDER_SITE_OTHER): Payer: BLUE CROSS/BLUE SHIELD | Admitting: Allergy and Immunology

## 2015-01-24 ENCOUNTER — Encounter: Payer: Self-pay | Admitting: Allergy and Immunology

## 2015-01-24 VITALS — BP 110/64 | HR 88 | Temp 98.1°F | Resp 18 | Ht 63.86 in | Wt 169.0 lb

## 2015-01-24 DIAGNOSIS — R21 Rash and other nonspecific skin eruption: Secondary | ICD-10-CM

## 2015-01-24 DIAGNOSIS — J309 Allergic rhinitis, unspecified: Secondary | ICD-10-CM | POA: Diagnosis not present

## 2015-01-24 DIAGNOSIS — H1010 Acute atopic conjunctivitis, unspecified eye: Secondary | ICD-10-CM | POA: Diagnosis not present

## 2015-01-24 NOTE — Patient Instructions (Addendum)
Take Home Sheet  1. Avoidance: Mite, Mold and Pollen    2. Antihistamine: Zyrtec   by mouth once daily for runny nose or itching.   3.Consider culture at Dr. Caren Macadam office if any new skin infection episodes.   4. Other: If new episodes of skin rashes or changes/hives take pictures and write down environment/exposure/ingestion/activity   Avoid fragranced soaps/lotions/detergents.  5. Nasal Saline wash each evening at shower time   6. Follow up Visit: 2 months or sooner if needed.   Websites that have reliable Patient information: 1. American Academy of Asthma, Allergy, & Immunology: www.aaaai.org 2. Food Allergy Network: www.foodallergy.org 3. Mothers of Asthmatics: www.aanma.org 4. [facility]: https://www.strong.com/ 5. American College of Allergy, Asthma, & Immunology: BiggerRewards.is or www.acaai.org

## 2015-01-24 NOTE — Progress Notes (Signed)
NEW PATIENT NOTE  RE: Cristian Austin MRN: [id number] DOB: Mar 07, 1950 [facility] [address] [place], [place] [address] Date of Office Visit: 01/24/2015  Referring provider: Donita Brooks, MD [address] [place], [place] [address]  Subjective:  Cristian Austin is a 65 y.o. male who presents today for Urticaria  Assessment:   1. Allergic rhinoconjunctivitis.   2. Suspected Hives, clear skin today- unclear etiology, possible outdoor trigger vs. relationship to #3.    3.      History of skin infection, apparently mild cellulitis treated on several occasions with oral antibiotics, including current course of Bactrim. 4.      Intermittent history of ibuprofen use. Plan:   Patient Instructions  1. Avoidance: Mite, Mold and Pollen  2. Antihistamine: Zyrtec   by mouth once daily for runny nose or itching. 3.Consider culture at Dr. Caren Macadam office if any new skin infection episodes. And assess need for further laboratory evaluation. 4. Other: If new episodes of skin rashes or changes/hives take pictures and write down environment/exposure/ingestion/activity---Avoid fragranced soaps/lotions/detergents. 5. Nasal Saline wash each evening at shower time. 6. Follow up Visit: 2 months or sooner if needed.  HPI: Cristian Austin, who has mild history of spring season rhinorrhea, congestion, sneezing, postnasal drip, likely provoked by outdoor, fluctuant weather patterns or pollen exposures presents with his wife (with varied history) reporting concern for skin difficulties since November 2016.  He describes migrating erythematous raised lesions initially at his arms, back and buttocks.  After a few hours areas seemed to progress and he noted swelling of his hands.  After completing a normal workday because of lack of improvement, he went to the emergency department.  Received treatment with Benadryl and prednisone, but was unsure of diagnosis.  He recalled prior to the  beginning of this episode, he completed yardwork blowing leaves, planting trees, etc. with his son.  While in the shower, itching began, but improved when he applied alcohol.  He denied any associated swelling of lips tongue or throat, GI or respiratory symptoms and no definitive timing of acute reaction or exposure as a trigger.  He thought his son had some mild itching and red rash as well.  The maintenance medications from the ED were beneficial but further mild episodes occurred intermittently in the following weeks, prompting at least one additional visit with primary M.D. He reports now hive free for the last 2 weeks.   He generally describes himself as sensitive dry skin, particularly in the winter and uses Dial antibacterial soap, after recent treatments for skin infections with Dr. Tanya Nones, including in the month of November (Levaquin and now Bactrim)  He tolerates various lotions including Jergens and his wife did change from liquid Tide to powder Surf (avoids Gain due to itching).  He has been avoiding [employer] and minimizing chemical exposures from work because was unsure of potential trigger.  Not recalling specific food triggers for difficulty and does not remember any generalized associated symptoms.  Over the years he has had tick bites.  His usual meals may include ham, sausage, toast, eggs, liver pudding, strawberries jelly, fried pork chop, chicken, blackeye and green peas, potato salad, peanut butter crackers and pistachios. He is unsure of specific meal the morning of or the night before his difficulty.  He does use ibuprofen intermittently, but cannot correlate to his recurring difficulty.  Medical History: Past Medical History  Diagnosis Date  . GERD (gastroesophageal reflux disease)  Surgical History: History reviewed. No pertinent past surgical history. Family History: Family History  Problem Relation Age of Onset  . Asthma Neg Hx   . Eczema Neg Hx   .  Immunodeficiency Neg Hx   . Allergic rhinitis Grandchild   . Urticaria Son    Social History: Social History  . Marital Status: married    Spouse Name: N/A  . Number of Children: N/A  . Years of Education: N/A   Social History Main Topics  . Smoking status: Former Smoker    Quit date: 10/12/1997  . Smokeless tobacco: Never Used  . Alcohol Use: No  . Drug Use: No  . 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  Medications prior to this encounter: Outpatient Prescriptions Prior to Visit  Medication Sig Dispense Refill  . famotidine (PEPCID) 20 MG tablet Take 1 tablet (20 mg total) by mouth 2 (two) times daily. 10 tablet 0  . omeprazole (PRILOSEC) 20 MG capsule TAKE (1) CAPSULE BY MOUTH ONCE DAILY. 30 capsule 11  . sildenafil (VIAGRA) 100 MG tablet Take 0.5-1 tablets (50-100 mg total) by mouth daily as needed for erectile dysfunction. 5 tablet 11  . sulfamethoxazole-trimethoprim (BACTRIM DS,SEPTRA DS) 800-160 MG tablet Take 1 tablet by mouth 2 (two) times daily. 10 tablet 0  . triamcinolone cream (KENALOG) 0.1 % Apply 1 application topically 2 (two) times daily. 80 g 1  . ibuprofen (ADVIL,MOTRIN) 200 MG tablet Take 400 mg by mouth every 6 (six) hours as needed for moderate pain. Reported on 01/24/2015     No facility-administered medications prior to visit.   Drug Allergies: Allergies  Allergen Reactions  . Asa [Aspirin]     GI upset   Environmental History: Cristian Austin lives in a 22 house since built with wood, carpeted floors, central air and heat; Stuffed mattress non-feather pillow and comforter without humidifier, or pets.  Review of Systems  Constitutional: Negative for fever, weight loss and malaise/fatigue.  HENT: Positive for congestion. Negative for ear pain, hearing loss, nosebleeds and sore throat.   Eyes: Negative for discharge and redness.  Respiratory: Negative for shortness of breath.        Denies  history of bronchitis and pneumonia.  Gastrointestinal: Positive for heartburn (specific foods.). Negative for nausea, vomiting, abdominal pain, diarrhea and constipation.  Genitourinary: Negative.   Musculoskeletal: Negative for myalgias and joint pain.  Skin: Positive for itching and rash.  Neurological: Negative.  Negative for dizziness, seizures, weakness and headaches.  Endo/Heme/Allergies: Positive for environmental allergies.       Denies sensitivity to aspirin, NSAIDs, stinging insects, foods, latex, jewelry and cosmetics.   Objective:   Filed Vitals:   01/24/15 0906  BP: 110/64  Pulse: 88  Temp: 98.1 F (36.7 C)  Resp: 18   SpO2 Readings from Last 1 Encounters:  01/24/15 96%   Physical Exam  Constitutional: He is well-developed, well-nourished, and in no distress.  HENT:  Head: Atraumatic.  Right Ear: Tympanic membrane and ear canal normal.  Left Ear: Tympanic membrane and ear canal normal.  Nose: Mucosal edema present. No rhinorrhea. No epistaxis.  Mouth/Throat: Oropharynx is clear and moist and mucous membranes are normal. No oropharyngeal exudate, posterior oropharyngeal edema or posterior oropharyngeal erythema.  Eyes: Conjunctivae are normal.  Neck: Neck supple.  Cardiovascular: Normal rate, S1 normal and S2 normal.   No murmur heard. Pulmonary/Chest: Effort normal and breath sounds normal. He has no  wheezes. He has no rhonchi. He has no rales.  Abdominal: Soft. Bowel sounds are normal.  Lymphadenopathy:    He has no cervical adenopathy.  Neurological: He is alert.  Skin: Skin is warm and intact. No rash noted. No cyanosis. Nails show no clubbing.  No dermatographism.   Diagnostics: Very strong reactivity to multiple grass pollens; moderate reactivity to selected mold species, dust mite mix and weed mix; mild reactivity to cockroach, dog epithelial, Alternaria, Cladosporium, Aspergillus and penicillium mold mix and minimal reactivity to ragweed mix and, cat  hair.    Roselyn M. Willa Rough, MD   cc: Leo Grosser, MD

## 2015-02-09 ENCOUNTER — Encounter: Payer: BC Managed Care – PPO | Admitting: Family Medicine

## 2015-02-19 ENCOUNTER — Encounter: Payer: Self-pay | Admitting: Allergy and Immunology

## 2015-04-04 ENCOUNTER — Ambulatory Visit: Payer: BLUE CROSS/BLUE SHIELD | Admitting: Allergy and Immunology

## 2015-06-06 ENCOUNTER — Ambulatory Visit: Payer: BC Managed Care – PPO | Admitting: Family Medicine

## 2015-08-15 ENCOUNTER — Encounter: Payer: Self-pay | Admitting: Orthopedic Surgery

## 2015-08-15 ENCOUNTER — Ambulatory Visit (INDEPENDENT_AMBULATORY_CARE_PROVIDER_SITE_OTHER): Payer: BLUE CROSS/BLUE SHIELD | Admitting: Orthopedic Surgery

## 2015-08-15 ENCOUNTER — Ambulatory Visit (INDEPENDENT_AMBULATORY_CARE_PROVIDER_SITE_OTHER): Payer: BLUE CROSS/BLUE SHIELD

## 2015-08-15 VITALS — BP 144/96 | HR 92 | Ht 65.0 in | Wt 173.8 lb

## 2015-08-15 DIAGNOSIS — M25572 Pain in left ankle and joints of left foot: Secondary | ICD-10-CM

## 2015-08-15 DIAGNOSIS — M76822 Posterior tibial tendinitis, left leg: Secondary | ICD-10-CM | POA: Diagnosis not present

## 2015-08-15 NOTE — Patient Instructions (Signed)
[facility]

## 2015-08-15 NOTE — Progress Notes (Signed)
Chief Complaint  Patient presents with  . 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 Throbbing burning aching. Mild tingling. No prior surgery or treatment. Worse with walking about 45 hours into his shift no trauma.  Review of Systems  Musculoskeletal: Positive for joint pain.  All other systems reviewed and are negative.   Past Medical History:  Diagnosis Date  . GERD (gastroesophageal reflux disease)     History reviewed. No pertinent surgical history. Family History  Problem Relation Age of Onset  . Allergic rhinitis Grandchild   . Urticaria Son   . Asthma Neg Hx   . Eczema Neg Hx   . Immunodeficiency Neg Hx    Social History  Substance Use Topics  . Smoking status: Former Smoker    Quit date: 10/12/1997  . Smokeless tobacco: Never Used  . Alcohol use No    Current Outpatient Prescriptions:  .  omeprazole (PRILOSEC) 20 MG capsule, TAKE (1) CAPSULE BY MOUTH ONCE DAILY., Disp: 30 capsule, Rfl: 11 .  sildenafil (VIAGRA) 100 MG tablet, Take 0.5-1 tablets (50-100 mg total) by mouth daily as needed for erectile dysfunction., Disp: 5 tablet, Rfl: 11 .  famotidine (PEPCID) 20 MG tablet, Take 1 tablet (20 mg total) by mouth 2 (two) times daily. (Patient not taking: Reported on 08/15/2015), Disp: 10 tablet, Rfl: 0 .  ibuprofen (ADVIL,MOTRIN) 200 MG tablet, Take 400 mg by mouth every 6 (six) hours as needed for moderate pain. Reported on 01/24/2015, Disp: , Rfl:   BP (!) 144/96   Pulse 92   Ht 5\' 5"  (1.651 m)   Wt 173 lb 12.8 oz (78.8 kg)   BMI 28.92 kg/m   Physical Exam  Constitutional: He is oriented to person, place, and time. He appears well-developed and well-nourished. No distress.  Cardiovascular: Normal rate and intact distal pulses.   Neurological: He is alert and oriented to person, place, and time.   Skin: Skin is warm and dry. No rash noted. He is not diaphoretic. No erythema. No pallor.  Psychiatric: He has a normal mood and affect. His behavior is normal. Judgment and thought content normal.    Ortho Exam Fairly normal foot alignment in the right and left foot. Range of motion at the ankle joint right and left normal. Both ankles are stable. Strength and muscle tone normal no atrophy in either foot. Skin normal.  Pulse and temperature normal in each foot normal sensation noted as well.  Left foot arch is fairly maintained with mild decrease in overall height. He has tenderness in the posterior tibial tendon and at the joint line medial and lateral. Most the tenderness Cristian Austin the posterior tibial tendon sheath    ASSESSMENT: My personal interpretation of the images:  Ankle x-rays are normal today  Encounter Diagnoses  Name Primary?  . Left ankle pain   . Posterior tibial tendinitis of left leg Yes     PLAN Recommend warm-and-form [facility] orthotics follow-up as needed  Cristian CanadaStanley Harrison, MD 08/15/2015 12:01 PM

## 2015-12-18 ENCOUNTER — Ambulatory Visit (INDEPENDENT_AMBULATORY_CARE_PROVIDER_SITE_OTHER): Payer: BLUE CROSS/BLUE SHIELD | Admitting: Family Medicine

## 2015-12-18 ENCOUNTER — Encounter: Payer: Self-pay | Admitting: Family Medicine

## 2015-12-18 VITALS — BP 144/98 | HR 72 | Temp 98.6°F | Resp 18 | Ht 65.0 in | Wt 175.0 lb

## 2015-12-18 DIAGNOSIS — R030 Elevated blood-pressure reading, without diagnosis of hypertension: Secondary | ICD-10-CM

## 2015-12-18 DIAGNOSIS — Z125 Encounter for screening for malignant neoplasm of prostate: Secondary | ICD-10-CM | POA: Diagnosis not present

## 2015-12-18 LAB — COMPLETE METABOLIC PANEL WITH GFR
ALBUMIN: 4 g/dL (ref 3.6–5.1)
ALK PHOS: 82 U/L (ref 40–115)
ALT: 16 U/L (ref 9–46)
AST: 16 U/L (ref 10–35)
BUN: 13 mg/dL (ref 7–25)
CALCIUM: 9.2 mg/dL (ref 8.6–10.3)
CO2: 24 mmol/L (ref 20–31)
Chloride: 107 mmol/L (ref 98–110)
Creat: 0.86 mg/dL (ref 0.70–1.25)
Glucose, Bld: 101 mg/dL — ABNORMAL HIGH (ref 70–99)
POTASSIUM: 4 mmol/L (ref 3.5–5.3)
Sodium: 139 mmol/L (ref 135–146)
Total Bilirubin: 0.2 mg/dL (ref 0.2–1.2)
Total Protein: 6.5 g/dL (ref 6.1–8.1)

## 2015-12-18 LAB — CBC WITH DIFFERENTIAL/PLATELET
BASOS ABS: 0 {cells}/uL (ref 0–200)
Basophils Relative: 0 %
EOS ABS: 525 {cells}/uL — AB (ref 15–500)
Eosinophils Relative: 7 %
HEMATOCRIT: 40.6 % (ref 38.5–50.0)
HEMOGLOBIN: 13.1 g/dL (ref 13.0–17.0)
LYMPHS ABS: 2550 {cells}/uL (ref 850–3900)
LYMPHS PCT: 34 %
MCH: 28.4 pg (ref 27.0–33.0)
MCHC: 32.3 g/dL (ref 32.0–36.0)
MCV: 87.9 fL (ref 80.0–100.0)
MONO ABS: 525 {cells}/uL (ref 200–950)
MPV: 10.1 fL (ref 7.5–12.5)
Monocytes Relative: 7 %
NEUTROS PCT: 52 %
Neutro Abs: 3900 cells/uL (ref 1500–7800)
Platelets: 278 10*3/uL (ref 140–400)
RBC: 4.62 MIL/uL (ref 4.20–5.80)
RDW: 14.8 % (ref 11.0–15.0)
WBC: 7.5 10*3/uL (ref 3.8–10.8)

## 2015-12-18 LAB — PSA: PSA: 0.4 ng/mL (ref <=4.0)

## 2015-12-18 NOTE — Progress Notes (Signed)
   Subjective:    Patient ID: Cristian Austin, male    DOB: [birth year], 65 y.o.   MRN: [id number]  HPI Patient is here today requesting a refill on his omeprazole which he takes for heartburn as well as his Viagra. Otherwise he is doing well. He is due for a flu shot but he politely declines this. He is overdue for prostate cancer screening. He declines a digital rectal exam but he will consent to a PSA. He is also overdue for colonoscopy but he refuses this. His blood pressure today is slightly elevated. It has never been high here in the past. He denies any chest pain shortness of breath or dyspnea on exertion. He is not smoking. He is overdue for fasting lab work. Past Medical History:  Diagnosis Date  . GERD (gastroesophageal reflux disease)    No past surgical history on file. Current Outpatient Prescriptions on File Prior to Visit  Medication Sig Dispense Refill  . omeprazole (PRILOSEC) 20 MG capsule TAKE (1) CAPSULE BY MOUTH ONCE DAILY. 30 capsule 11  . sildenafil (VIAGRA) 100 MG tablet Take 0.5-1 tablets (50-100 mg total) by mouth daily as needed for erectile dysfunction. 5 tablet 11   No current facility-administered medications on file prior to visit.    Allergies  Allergen Reactions  . Asa [Aspirin]     GI upset   Social History   Social History  . Marital status: Single    Spouse name: N/A  . Number of children: N/A  . Years of education: N/A   Occupational History  . Not on file.   Social History Main Topics  . Smoking status: Former Smoker    Quit date: 10/12/1997  . Smokeless tobacco: Never Used  . Alcohol use No  . Drug use: No  . Sexual activity: Not on file   Other Topics Concern  . Not on file   Social History Narrative  . No narrative on file       Review of Systems  All other systems reviewed and are negative.      Objective:   Physical Exam  Constitutional: He appears well-developed and well-nourished. No distress.  Neck: Neck supple. No  JVD present. No thyromegaly present.  Cardiovascular: Normal rate, regular rhythm and normal heart sounds.   Pulmonary/Chest: Effort normal and breath sounds normal. No respiratory distress. He has no wheezes. He has no rales.  Abdominal: Soft. Bowel sounds are normal. He exhibits no distension. There is no tenderness. There is no rebound and no guarding.  Musculoskeletal: He exhibits no edema.  Lymphadenopathy:    He has no cervical adenopathy.  Skin: He is not diaphoretic.          Assessment & Plan:  Elevated blood-pressure reading without diagnosis of hypertension - Plan: CBC with Differential/Platelet, COMPLETE METABOLIC PANEL WITH GFR  Screening for prostate cancer - Plan: PSA I've asked the patient to come by several times over the next week so that we can recheck his blood pressure is elevated here. Also recommended exercise and low-salt diet. I will check a PSA. He refuses a colonoscopy. He refuses a flu shot. I will also check a CBC as well as a CMP. I have asked the patient to return fasting for fasting lipid panel at his convenience. Continue omeprazole which he uses for daily heartburn. He can also use Viagra as needed for arterial insufficiency erectile dysfunction

## 2015-12-19 ENCOUNTER — Other Ambulatory Visit: Payer: Self-pay

## 2015-12-19 ENCOUNTER — Ambulatory Visit: Payer: BLUE CROSS/BLUE SHIELD

## 2015-12-19 VITALS — BP 140/96

## 2015-12-19 DIAGNOSIS — R030 Elevated blood-pressure reading, without diagnosis of hypertension: Secondary | ICD-10-CM

## 2015-12-19 MED ORDER — OMEPRAZOLE 20 MG PO CPDR: DELAYED_RELEASE_CAPSULE | ORAL | 11 refills | Status: DC

## 2015-12-19 MED ORDER — SILDENAFIL CITRATE 100 MG PO TABS: 50.0000 mg | ORAL_TABLET | Freq: Every day | ORAL | 11 refills | Status: DC | PRN

## 2015-12-19 NOTE — Telephone Encounter (Signed)
Rx filled per protocol  

## 2015-12-22 ENCOUNTER — Encounter: Payer: Self-pay | Admitting: *Deleted

## 2017-02-28 ENCOUNTER — Encounter: Payer: Self-pay | Admitting: Family Medicine

## 2017-02-28 ENCOUNTER — Ambulatory Visit: Payer: BLUE CROSS/BLUE SHIELD | Admitting: Family Medicine

## 2017-02-28 VITALS — BP 144/100 | HR 80 | Temp 98.2°F | Resp 14 | Ht 65.0 in | Wt 178.0 lb

## 2017-02-28 DIAGNOSIS — I10 Essential (primary) hypertension: Secondary | ICD-10-CM | POA: Diagnosis not present

## 2017-02-28 DIAGNOSIS — Z125 Encounter for screening for malignant neoplasm of prostate: Secondary | ICD-10-CM | POA: Diagnosis not present

## 2017-02-28 MED ORDER — OMEPRAZOLE 20 MG PO CPDR: DELAYED_RELEASE_CAPSULE | ORAL | 11 refills | Status: DC

## 2017-02-28 MED ORDER — HYDROCHLOROTHIAZIDE 12.5 MG PO CAPS: 12.5000 mg | ORAL_CAPSULE | Freq: Every day | ORAL | 11 refills | Status: DC

## 2017-02-28 NOTE — Progress Notes (Signed)
Subjective:    Patient ID: Cristian Austin, male    DOB: January 31, 1950, 67 y.o.   MRN: [id number]  HPI  12/2015 Patient is here today requesting a refill on his omeprazole which he takes for heartburn as well as his Viagra. Otherwise he is doing well. He is due for a flu shot but he politely declines this. He is overdue for prostate cancer screening. He declines a digital rectal exam but he will consent to a PSA. He is also overdue for colonoscopy but he refuses this. His blood pressure today is slightly elevated. It has never been high here in the past. He denies any chest pain shortness of breath or dyspnea on exertion. He is not smoking. He is overdue for fasting lab work.  At that time, my plan was: I've asked the patient to come by several times over the next week so that we can recheck his blood pressure is elevated here. Also recommended exercise and low-salt diet. I will check a PSA. He refuses a colonoscopy. He refuses a flu shot. I will also check a CBC as well as a CMP. I have asked the patient to return fasting for fasting lipid panel at his convenience. Continue omeprazole which he uses for daily heartburn. He can also use Viagra as needed for arterial insufficiency erectile dysfunction  02/28/17 Patient is here today requesting a refill on his heartburn medication.  Without the medication, he will developed severe heartburn even to mild foods within a few days.  On the medication, his heartburn is well controlled.  He is well overdue for preventative care.  He has never had a colonoscopy.  He is due for HIV and hepatitis C screening.  He is due for prostate cancer screening.  He is due for a flu shot.  He is due for Prevnar 13 and Pneumovax 23.  His blood pressure today is elevated.  I repeated and confirmed it to be 158/100. Past Medical History:  Diagnosis Date  . GERD (gastroesophageal reflux disease)    No past surgical history on file. Current Outpatient Medications on File Prior to  Visit  Medication Sig Dispense Refill  . omeprazole (PRILOSEC) 20 MG capsule TAKE (1) CAPSULE BY MOUTH ONCE DAILY. 30 capsule 11  . sildenafil (VIAGRA) 100 MG tablet Take 0.5-1 tablets (50-100 mg total) by mouth daily as needed for erectile dysfunction. 5 tablet 11   No current facility-administered medications on file prior to visit.    Allergies  Allergen Reactions  . Asa [Aspirin]     GI upset   Social History   Socioeconomic History  . Marital status: Single    Spouse name: Not on file  . Number of children: Not on file  . Years of education: Not on file  . Highest education level: Not on file  Social Needs  . Financial resource strain: Not on file  . Food insecurity - worry: Not on file  . Food insecurity - inability: Not on file  . Transportation needs - medical: Not on file  . Transportation needs - non-medical: Not on file  Occupational History  . Not on file  Tobacco Use  . Smoking status: Former Smoker    Last attempt to quit: 10/12/1997    Years since quitting: 19.3  . Smokeless tobacco: Never Used  Substance and Sexual Activity  . Alcohol use: No  . Drug use: No  . Sexual activity: Not on file  Other Topics Concern  . Not on file  Social History Narrative  . Not on file       Review of Systems  All other systems reviewed and are negative.      Objective:   Physical Exam  Constitutional: He appears well-developed and well-nourished. No distress.  Neck: Neck supple. No JVD present. No thyromegaly present.  Cardiovascular: Normal rate, regular rhythm and normal heart sounds.  Pulmonary/Chest: Effort normal and breath sounds normal. No respiratory distress. He has no wheezes. He has no rales.  Abdominal: Soft. Bowel sounds are normal. He exhibits no distension. There is no tenderness. There is no rebound and no guarding.  Musculoskeletal: He exhibits no edema.  Lymphadenopathy:    He has no cervical adenopathy.  Skin: He is not diaphoretic.           Assessment & Plan:  Benign essential HTN - Plan: CBC with Differential/Platelet, COMPLETE METABOLIC PANEL WITH GFR, LDL Cholesterol, Direct  Prostate cancer screening - Plan: PSA  This is the second visit where his blood pressure has been elevated.  I have recommended treating with hydrochlorothiazide 12.5 mg p.o. daily and rechecking his blood pressure in 1 month.  I will check a CBC, CMP, and direct LDL.  I will also screen for prostate cancer with a PSA.  He refuses a colonoscopy.  He refuses a flu shot.  He declines HIV and hepatitis C screening at the present time.  I have recommended a physical exam with preventative care I have mentioned above when he changes his mind but at the present time he elects only to treat his blood pressure.  I did refill his omeprazole which he takes on a daily basis to control his recalcitrant heartburn.

## 2017-03-02 LAB — COMPLETE METABOLIC PANEL WITH GFR
AG RATIO: 1.4 (calc) (ref 1.0–2.5)
ALKALINE PHOSPHATASE (APISO): 85 U/L (ref 40–115)
ALT: 18 U/L (ref 9–46)
AST: 17 U/L (ref 10–35)
Albumin: 4.1 g/dL (ref 3.6–5.1)
BUN: 14 mg/dL (ref 7–25)
CO2: 25 mmol/L (ref 20–32)
Calcium: 9.9 mg/dL (ref 8.6–10.3)
Chloride: 104 mmol/L (ref 98–110)
Creat: 0.94 mg/dL (ref 0.70–1.25)
GFR, Est African American: 98 mL/min/{1.73_m2} (ref >=60)
GFR, Est Non African American: 85 mL/min/{1.73_m2} (ref >=60)
GLOBULIN: 2.9 g/dL (ref 1.9–3.7)
Glucose, Bld: 120 mg/dL — ABNORMAL HIGH (ref 65–99)
POTASSIUM: 4.2 mmol/L (ref 3.5–5.3)
SODIUM: 139 mmol/L (ref 135–146)
Total Bilirubin: 0.2 mg/dL (ref 0.2–1.2)
Total Protein: 7 g/dL (ref 6.1–8.1)

## 2017-03-02 LAB — CBC WITH DIFFERENTIAL/PLATELET
BASOS ABS: 50 {cells}/uL (ref 0–200)
Basophils Relative: 0.6 %
EOS ABS: 340 {cells}/uL (ref 15–500)
Eosinophils Relative: 4.1 %
HEMATOCRIT: 40.8 % (ref 38.5–50.0)
Hemoglobin: 13.7 g/dL (ref 13.2–17.1)
LYMPHS ABS: 2158 {cells}/uL (ref 850–3900)
MCH: 28.5 pg (ref 27.0–33.0)
MCHC: 33.6 g/dL (ref 32.0–36.0)
MCV: 85 fL (ref 80.0–100.0)
MPV: 11.2 fL (ref 7.5–12.5)
Monocytes Relative: 9.1 %
NEUTROS PCT: 60.2 %
Neutro Abs: 4997 cells/uL (ref 1500–7800)
Platelets: 284 10*3/uL (ref 140–400)
RBC: 4.8 10*6/uL (ref 4.20–5.80)
RDW: 13.6 % (ref 11.0–15.0)
TOTAL LYMPHOCYTE: 26 %
WBC: 8.3 10*3/uL (ref 3.8–10.8)
WBCMIX: 755 {cells}/uL (ref 200–950)

## 2017-03-02 LAB — PSA: PSA: 0.4 ng/mL (ref <=4.0)

## 2017-03-02 LAB — LDL CHOLESTEROL, DIRECT: Direct LDL: 126 mg/dL — ABNORMAL HIGH (ref <100)

## 2017-03-03 ENCOUNTER — Encounter: Payer: Self-pay | Admitting: Family Medicine

## 2017-03-14 ENCOUNTER — Ambulatory Visit: Payer: BLUE CROSS/BLUE SHIELD | Admitting: Family Medicine

## 2017-03-14 VITALS — BP 130/90

## 2017-03-14 DIAGNOSIS — I10 Essential (primary) hypertension: Secondary | ICD-10-CM

## 2017-03-14 NOTE — Progress Notes (Signed)
Per Dr. Tanya NonesPickard his BP is good. Continue BP med and stop by one day next week and let us check it again. Pt agreed.

## 2017-04-03 ENCOUNTER — Ambulatory Visit: Payer: BLUE CROSS/BLUE SHIELD | Admitting: Family Medicine

## 2017-04-03 ENCOUNTER — Encounter: Payer: Self-pay | Admitting: Family Medicine

## 2017-04-03 VITALS — BP 134/82 | HR 106 | Temp 98.0°F | Resp 14 | Ht 65.0 in | Wt 173.0 lb

## 2017-04-03 DIAGNOSIS — I10 Essential (primary) hypertension: Secondary | ICD-10-CM | POA: Diagnosis not present

## 2017-04-03 MED ORDER — SILDENAFIL CITRATE 100 MG PO TABS: 50.0000 mg | ORAL_TABLET | Freq: Every day | ORAL | 11 refills | Status: DC | PRN

## 2017-04-03 NOTE — Progress Notes (Signed)
   Subjective:    Patient ID: Cristian Austin, male    DOB: 1950-11-22, 67 y.o.   MRN: [id number]  HPI  Patient is currently on hydrochlorothiazide for hypertension.  At his last visit, his blood pressure was elevated at 144/100.  He is tolerating hydrochlorothiazide without difficulty.  He denies any chest pain.  He denies any shortness of breath.  He denies any dyspnea on exertion.  He denies any cramps on hydrochlorothiazide. Past Medical History:  Diagnosis Date  . GERD (gastroesophageal reflux disease)    Current Outpatient Medications on File Prior to Visit  Medication Sig Dispense Refill  . hydrochlorothiazide (MICROZIDE) 12.5 MG capsule Take 1 capsule (12.5 mg total) by mouth daily. 30 capsule 11  . omeprazole (PRILOSEC) 20 MG capsule TAKE (1) CAPSULE BY MOUTH ONCE DAILY. 30 capsule 11   No current facility-administered medications on file prior to visit.    No past surgical history on file. Allergies  Allergen Reactions  . Asa [Aspirin]     GI upset   Social History   Socioeconomic History  . Marital status: Single    Spouse name: Not on file  . Number of children: Not on file  . Years of education: Not on file  . Highest education level: Not on file  Occupational History  . Not on file  Social Needs  . Financial resource strain: Not on file  . Food insecurity:    Worry: Not on file    Inability: Not on file  . Transportation needs:    Medical: Not on file    Non-medical: Not on file  Tobacco Use  . Smoking status: Former Smoker    Last attempt to quit: 10/12/1997    Years since quitting: 19.4  . Smokeless tobacco: Never Used  Substance and Sexual Activity  . Alcohol use: No  . Drug use: No  . Sexual activity: Not on file  Lifestyle  . Physical activity:    Days per week: Not on file    Minutes per session: Not on file  . Stress: Not on file  Relationships  . Social connections:    Talks on phone: Not on file    Gets together: Not on file    Attends  religious service: Not on file    Active member of club or organization: Not on file    Attends meetings of clubs or organizations: Not on file    Relationship status: Not on file  . Intimate partner violence:    Fear of current or ex partner: Not on file    Emotionally abused: Not on file    Physically abused: Not on file    Forced sexual activity: Not on file  Other Topics Concern  . Not on file  Social History Narrative  . Not on file     Review of Systems  All other systems reviewed and are negative.      Objective:   Physical Exam  Constitutional: He appears well-developed and well-nourished.  Cardiovascular: Normal rate, regular rhythm and normal heart sounds.  No murmur heard. Pulmonary/Chest: Effort normal and breath sounds normal. No respiratory distress. He has no wheezes. He has no rales.  Musculoskeletal: He exhibits no edema.  Vitals reviewed.         Assessment & Plan:  Benign essential HTN  Blood pressure is now adequately controlled.  Continue to monitor blood pressure.  Refilled the patient's Viagra.

## 2018-03-17 ENCOUNTER — Encounter: Payer: Self-pay | Admitting: Family Medicine

## 2018-03-17 ENCOUNTER — Ambulatory Visit: Payer: BLUE CROSS/BLUE SHIELD | Admitting: Family Medicine

## 2018-03-17 VITALS — BP 132/90 | HR 80 | Temp 98.3°F | Resp 14 | Ht 65.0 in | Wt 175.0 lb

## 2018-03-17 DIAGNOSIS — Z1211 Encounter for screening for malignant neoplasm of colon: Secondary | ICD-10-CM

## 2018-03-17 DIAGNOSIS — I10 Essential (primary) hypertension: Secondary | ICD-10-CM | POA: Diagnosis not present

## 2018-03-17 DIAGNOSIS — Z125 Encounter for screening for malignant neoplasm of prostate: Secondary | ICD-10-CM

## 2018-03-17 MED ORDER — HYDROCHLOROTHIAZIDE 12.5 MG PO CAPS: 12.5000 mg | ORAL_CAPSULE | Freq: Every day | ORAL | 3 refills | Status: DC

## 2018-03-17 NOTE — Progress Notes (Signed)
Subjective:    Patient ID: Cristian Austin, male    DOB: 1950-11-02, 68 y.o.   MRN: [id number]  HPI  Patient has a history of hypertension.  He is currently on hydrochlorothiazide.  His blood pressure today is adequately controlled at 132/90.  He denies any chest pain shortness of breath or dyspnea on exertion.  However he is overdue for several preventative measures.  He is overdue for colonoscopy.  He is overdue for a PSA to screen for prostate cancer.  He is due for a flu shot.  He is due for a tetanus shot.  He is due for Pneumovax 23.  He is overdue for fasting lab work.  We spent 15 minutes today going over his preventative care.  Patient declines lab work today.  He states that he will come back next week fasting so that we can check all the requisite fasting lab work.  He refuses a flu shot and tetanus shot as well as the pneumonia vaccine today.  He declines a colonoscopy at the present time.  He will allow me to check a PSA with lab work.  I did mention Cologuard and he also declines this at the present time Past Medical History:  Diagnosis Date  . GERD (gastroesophageal reflux disease)    Current Outpatient Medications on File Prior to Visit  Medication Sig Dispense Refill  . omeprazole (PRILOSEC) 20 MG capsule TAKE (1) CAPSULE BY MOUTH ONCE DAILY. 30 capsule 11  . sildenafil (VIAGRA) 100 MG tablet Take 0.5-1 tablets (50-100 mg total) by mouth daily as needed for erectile dysfunction. 5 tablet 11   No current facility-administered medications on file prior to visit.    No past surgical history on file. Allergies  Allergen Reactions  . Asa [Aspirin]     GI upset   Social History   Socioeconomic History  . Marital status: Single    Spouse name: Not on file  . Number of children: Not on file  . Years of education: Not on file  . Highest education level: Not on file  Occupational History  . Not on file  Social Needs  . Financial resource strain: Not on file  . Food  insecurity:    Worry: Not on file    Inability: Not on file  . Transportation needs:    Medical: Not on file    Non-medical: Not on file  Tobacco Use  . Smoking status: Former Smoker    Last attempt to quit: 10/12/1997    Years since quitting: 20.4  . Smokeless tobacco: Never Used  Substance and Sexual Activity  . Alcohol use: No  . Drug use: No  . Sexual activity: Not on file  Lifestyle  . Physical activity:    Days per week: Not on file    Minutes per session: Not on file  . Stress: Not on file  Relationships  . Social connections:    Talks on phone: Not on file    Gets together: Not on file    Attends religious service: Not on file    Active member of club or organization: Not on file    Attends meetings of clubs or organizations: Not on file    Relationship status: Not on file  . Intimate partner violence:    Fear of current or ex partner: Not on file    Emotionally abused: Not on file    Physically abused: Not on file    Forced sexual activity: Not on file  Other Topics Concern  . Not on file  Social History Narrative  . Not on file     Review of Systems  All other systems reviewed and are negative.      Objective:   Physical Exam  Constitutional: He appears well-developed and well-nourished.  Cardiovascular: Normal rate, regular rhythm and normal heart sounds.  No murmur heard. Pulmonary/Chest: Effort normal and breath sounds normal. No respiratory distress. He has no wheezes. He has no rales.  Musculoskeletal:        General: No edema.  Vitals reviewed.         Assessment & Plan:  Benign essential HTN - Plan: CBC with Differential/Platelet, COMPLETE METABOLIC PANEL WITH GFR, Lipid panel  Prostate cancer screening - Plan: PSA  Colon cancer screening  Blood pressure is now adequately controlled.  Continue to monitor blood pressure.  I have asked the patient to return fasting for a CBC, CMP, and fasting lipid panel.  I will screen for prostate  cancer with a PSA.  I recommended a flu shot and a tetanus shot which he declined.  Also recommended Pneumovax 23 which he declined.  He states that he will think about it and perhaps return for these vaccines in the future.  I recommended a colonoscopy which the patient declined at the present time.  Also mention Cologuard but he declines this as well.

## 2018-06-18 ENCOUNTER — Ambulatory Visit: Payer: BC Managed Care – PPO | Admitting: Family Medicine

## 2018-06-18 ENCOUNTER — Encounter: Payer: Self-pay | Admitting: Family Medicine

## 2018-06-18 ENCOUNTER — Other Ambulatory Visit: Payer: Self-pay

## 2018-06-18 VITALS — BP 138/72 | HR 84 | Temp 98.3°F | Resp 16 | Ht 65.0 in | Wt 172.0 lb

## 2018-06-18 DIAGNOSIS — G8929 Other chronic pain: Secondary | ICD-10-CM | POA: Diagnosis not present

## 2018-06-18 DIAGNOSIS — I10 Essential (primary) hypertension: Secondary | ICD-10-CM

## 2018-06-18 DIAGNOSIS — Z125 Encounter for screening for malignant neoplasm of prostate: Secondary | ICD-10-CM | POA: Diagnosis not present

## 2018-06-18 DIAGNOSIS — M25572 Pain in left ankle and joints of left foot: Secondary | ICD-10-CM | POA: Diagnosis not present

## 2018-06-18 MED ORDER — HYDROCHLOROTHIAZIDE 12.5 MG PO CAPS: 12.5000 mg | ORAL_CAPSULE | Freq: Every day | ORAL | 3 refills | Status: DC

## 2018-06-18 MED ORDER — MELOXICAM 15 MG PO TABS: 15.0000 mg | ORAL_TABLET | Freq: Every day | ORAL | 0 refills | Status: DC

## 2018-06-18 MED ORDER — OMEPRAZOLE 20 MG PO CPDR: DELAYED_RELEASE_CAPSULE | ORAL | 11 refills | Status: DC

## 2018-06-18 MED ORDER — SILDENAFIL CITRATE 100 MG PO TABS: 50.0000 mg | ORAL_TABLET | Freq: Every day | ORAL | 11 refills | Status: DC | PRN

## 2018-06-18 NOTE — Progress Notes (Signed)
Subjective:    Patient ID: Cristian Austin, male    DOB: 06-14-1950, 68 y.o.   MRN: [id number]  Patient presents today complaining of pain in his left ankle.  The pain is been gradually worsening over the last 8 or 9 months.  The pain is located posterior to the medial and lateral malleolus but is not in the Achilles tendon.  It aches and throbs the longer he is on his feet during the day.  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  The pain is worse with walking.  Occasionally has pain in the anterior portion of the ankle joint with flexion of the ankle joint.  Pain is always exacerbated by standing and walking.  He denies any falls or injuries.  Approximately 6 months ago, he went to an orthopedist office and reportedly had an x-ray that showed no evidence of arthritis.  He also has a history of hypertension who is on hydrochlorothiazide.  His blood pressure today is adequately controlled at 138/72.  He is long overdue for fasting lab work to monitor his blood sugar, renal function, liver function tests, and cholesterol.  He is also overdue for prostate cancer screening. Past Medical History:  Diagnosis Date  . GERD (gastroesophageal reflux disease)    No current outpatient medications on file prior to visit.   No current facility-administered medications on file prior to visit.    No past surgical history on file. Allergies  Allergen Reactions  . Asa [Aspirin]     GI upset   Social History   Socioeconomic History  . Marital status: Single    Spouse name: Not on file  . Number of children: Not on file  . Years of education: Not on file  . Highest education level: Not on file  Occupational History  . Not on file  Social Needs  . Financial resource strain: Not on file  . Food insecurity    Worry: Not on file    Inability: Not on file  . Transportation needs    Medical: Not on file    Non-medical: Not on file  Tobacco Use  . Smoking status: Former Smoker    Quit date: 10/12/1997    Years since quitting: 20.6  . Smokeless tobacco: Never Used  Substance and Sexual Activity  . Alcohol use: No  . Drug use: No  . Sexual activity: Not on file  Lifestyle  . Physical activity    Days per week: Not on file    Minutes per session: Not on file  . Stress: Not on file  Relationships  . Social Herbalist on phone: Not on file    Gets together: Not on file    Attends religious service: Not on file    Active member of club or organization: Not on file    Attends meetings of clubs or organizations: Not on file    Relationship status: Not on file  . Intimate partner violence    Fear of current or ex partner: Not on file    Emotionally abused: Not on file    Physically abused: Not on file    Forced sexual activity: Not on file  Other Topics Concern  . Not on file  Social History Narrative  . Not on file     Review of Systems  All other systems reviewed and are negative.      Objective:   Physical Exam  Constitutional: He appears well-developed and  well-nourished.  Cardiovascular: Normal rate, regular rhythm and normal heart sounds.  No murmur heard. Pulmonary/Chest: Effort normal and breath sounds normal. No respiratory distress. He has no wheezes. He has no rales.  Musculoskeletal:        General: No edema.  Vitals reviewed.         Assessment & Plan:  1. Benign essential HTN Blood pressure today is adequately controlled but I would like the patient to return fasting so that could check a CMP and a fasting lipid panel and monitor his renal function on the hydrochlorothiazide along with his potassium - CBC with Differential/Platelet - COMPLETE METABOLIC PANEL WITH GFR - Lipid panel  2. Prostate cancer screening When he returns for lab work I would like to check a PSA to screen for prostate cancer - PSA  3. Chronic pain of left ankle I believe the pain in his left ankle is likely mild osteoarthritis in the ankle  joint.  He is already had a reportedly normal x-ray.  I will try treating the patient empirically with meloxicam 15 mg daily and if his pain improves back down to taking it every day only as needed.  If pain is not improving, I would recommend repeat imaging of the ankle - meloxicam (MOBIC) 15 MG tablet; Take 1 tablet (15 mg total) by mouth daily.  Dispense: 30 tablet; Refill: 0

## 2019-02-21 ENCOUNTER — Encounter (HOSPITAL_COMMUNITY): Payer: Self-pay | Admitting: *Deleted

## 2019-02-21 ENCOUNTER — Other Ambulatory Visit: Payer: Self-pay

## 2019-02-21 ENCOUNTER — Emergency Department (HOSPITAL_COMMUNITY)
Admission: EM | Admit: 2019-02-21 | Discharge: 2019-02-21 | Disposition: A | Discharge status: Home / Self Care | Payer: BC Managed Care – PPO | Attending: Emergency Medicine | Admitting: Emergency Medicine

## 2019-02-21 ENCOUNTER — Emergency Department (HOSPITAL_COMMUNITY): Payer: BC Managed Care – PPO

## 2019-02-21 DIAGNOSIS — Y999 Unspecified external cause status: Secondary | ICD-10-CM | POA: Diagnosis not present

## 2019-02-21 DIAGNOSIS — Z87891 Personal history of nicotine dependence: Secondary | ICD-10-CM | POA: Diagnosis not present

## 2019-02-21 DIAGNOSIS — M25531 Pain in right wrist: Secondary | ICD-10-CM | POA: Diagnosis not present

## 2019-02-21 DIAGNOSIS — S299XXA Unspecified injury of thorax, initial encounter: Secondary | ICD-10-CM | POA: Diagnosis not present

## 2019-02-21 DIAGNOSIS — S60221A Contusion of right hand, initial encounter: Secondary | ICD-10-CM | POA: Diagnosis not present

## 2019-02-21 DIAGNOSIS — I10 Essential (primary) hypertension: Secondary | ICD-10-CM | POA: Insufficient documentation

## 2019-02-21 DIAGNOSIS — S6991XA Unspecified injury of right wrist, hand and finger(s), initial encounter: Secondary | ICD-10-CM | POA: Diagnosis not present

## 2019-02-21 DIAGNOSIS — S39012A Strain of muscle, fascia and tendon of lower back, initial encounter: Secondary | ICD-10-CM | POA: Insufficient documentation

## 2019-02-21 DIAGNOSIS — S20212A Contusion of left front wall of thorax, initial encounter: Secondary | ICD-10-CM | POA: Insufficient documentation

## 2019-02-21 DIAGNOSIS — R52 Pain, unspecified: Secondary | ICD-10-CM | POA: Diagnosis not present

## 2019-02-21 DIAGNOSIS — Y92410 Unspecified street and highway as the place of occurrence of the external cause: Secondary | ICD-10-CM | POA: Insufficient documentation

## 2019-02-21 DIAGNOSIS — M79641 Pain in right hand: Secondary | ICD-10-CM | POA: Diagnosis not present

## 2019-02-21 DIAGNOSIS — Z79899 Other long term (current) drug therapy: Secondary | ICD-10-CM | POA: Diagnosis not present

## 2019-02-21 DIAGNOSIS — S3991XA Unspecified injury of abdomen, initial encounter: Secondary | ICD-10-CM | POA: Diagnosis not present

## 2019-02-21 DIAGNOSIS — Y93I9 Activity, other involving external motion: Secondary | ICD-10-CM | POA: Diagnosis not present

## 2019-02-21 DIAGNOSIS — R079 Chest pain, unspecified: Secondary | ICD-10-CM | POA: Diagnosis not present

## 2019-02-21 DIAGNOSIS — M79643 Pain in unspecified hand: Secondary | ICD-10-CM | POA: Diagnosis not present

## 2019-02-21 HISTORY — DX: Essential (primary) hypertension: I10

## 2019-02-21 LAB — BASIC METABOLIC PANEL
Anion gap: 8 (ref 5–15)
BUN: 12 mg/dL (ref 8–23)
CO2: 27 mmol/L (ref 22–32)
Calcium: 9.1 mg/dL (ref 8.9–10.3)
Chloride: 100 mmol/L (ref 98–111)
Creatinine, Ser: 0.89 mg/dL (ref 0.61–1.24)
GFR calc Af Amer: >60 mL/min (ref >60)
GFR calc non Af Amer: >60 mL/min (ref >60)
Glucose, Bld: 122 mg/dL — ABNORMAL HIGH (ref 70–99)
Potassium: 4.1 mmol/L (ref 3.5–5.1)
Sodium: 135 mmol/L (ref 135–145)

## 2019-02-21 LAB — CBC
HCT: 44.5 % (ref 39.0–52.0)
Hemoglobin: 13.8 g/dL (ref 13.0–17.0)
MCH: 27.8 pg (ref 26.0–34.0)
MCHC: 31 g/dL (ref 30.0–36.0)
MCV: 89.7 fL (ref 80.0–100.0)
Platelets: 294 10*3/uL (ref 150–400)
RBC: 4.96 MIL/uL (ref 4.22–5.81)
RDW: 14.6 % (ref 11.5–15.5)
WBC: 9.6 10*3/uL (ref 4.0–10.5)
nRBC: 0 % (ref 0.0–0.2)

## 2019-02-21 LAB — TROPONIN I (HIGH SENSITIVITY): Troponin I (High Sensitivity): <2 ng/L (ref <18)

## 2019-02-21 MED ORDER — IOHEXOL 300 MG/ML  SOLN
100.0000 mL | Freq: Once | INTRAMUSCULAR | Status: AC | PRN
  Administered 2019-02-21: 100 mL via INTRAVENOUS
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
## 2019-02-21 NOTE — Discharge Instructions (Addendum)
Ibuprofen 600 mg every 6 hours as needed for pain.  Rest.  Follow-up with your primary doctor if you are not feeling better in the next week, and return to the ER if symptoms significantly worsen or change.

## 2019-02-21 NOTE — ED Notes (Signed)
Pt called out to urinate. Handed pt urinal and instructed to stay seated on bed while using. PT refusing to sit at this time and is pacing around room.
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
## 2019-02-23 ENCOUNTER — Encounter: Payer: Self-pay | Admitting: Family Medicine

## 2019-02-23 ENCOUNTER — Other Ambulatory Visit: Payer: Self-pay

## 2019-02-23 ENCOUNTER — Ambulatory Visit (INDEPENDENT_AMBULATORY_CARE_PROVIDER_SITE_OTHER): Payer: BC Managed Care – PPO | Admitting: Family Medicine

## 2019-02-23 DIAGNOSIS — S134XXA Sprain of ligaments of cervical spine, initial encounter: Secondary | ICD-10-CM | POA: Diagnosis not present

## 2019-02-23 DIAGNOSIS — M542 Cervicalgia: Secondary | ICD-10-CM | POA: Diagnosis not present

## 2019-02-23 MED ORDER — TIZANIDINE HCL 4 MG PO TABS: 4.0000 mg | ORAL_TABLET | Freq: Four times a day (QID) | ORAL | 0 refills | Status: DC | PRN
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
## 2019-03-01 ENCOUNTER — Telehealth: Payer: Self-pay | Admitting: Family Medicine

## 2019-03-01 ENCOUNTER — Encounter: Payer: Self-pay | Admitting: Family Medicine

## 2019-03-07 ENCOUNTER — Other Ambulatory Visit: Payer: Self-pay

## 2019-03-07 ENCOUNTER — Ambulatory Visit: Payer: BC Managed Care – PPO | Attending: Internal Medicine

## 2019-03-07 DIAGNOSIS — Z23 Encounter for immunization: Secondary | ICD-10-CM | POA: Insufficient documentation

## 2019-03-07 NOTE — Progress Notes (Signed)
   Covid-19 Vaccination Clinic  Name:  Cristian Austin    MRN: [id number] DOB: 11-21-1950  03/07/2019  Cristian Austin was observed post Covid-19 immunization for 15 minutes without incidence. He was provided with Vaccine Information Sheet and instruction to access the V-Safe system.   Cristian Austin was instructed to call 911 with any severe reactions post vaccine: Marland Kitchen Difficulty breathing  . Swelling of your face and throat  . A fast heartbeat  . A bad rash all over your body  . Dizziness and weakness    Immunizations Administered    Name Date Dose VIS Date Route   Moderna COVID-19 Vaccine 03/07/2019 10:01 AM 0.5 mL 12/08/2018 Intramuscular   Manufacturer: Moderna   Lot: [id number]   NDC: 47158-063-86

## 2019-03-22 ENCOUNTER — Other Ambulatory Visit: Payer: Self-pay | Admitting: Family Medicine

## 2019-03-22 NOTE — Telephone Encounter (Signed)
Ok to refill 

## 2019-04-10 ENCOUNTER — Ambulatory Visit: Payer: Self-pay | Attending: Internal Medicine

## 2019-04-10 DIAGNOSIS — Z23 Encounter for immunization: Secondary | ICD-10-CM

## 2019-04-10 NOTE — Progress Notes (Signed)
   Covid-19 Vaccination Clinic  Name:  Cristian Austin    MRN: [id number] DOB: 07/16/1950  04/10/2019  Mr. Cristian Austin was observed post Covid-19 immunization for 15 minutes without incident. He was provided with Vaccine Information Sheet and instruction to access the V-Safe system.   Mr. Cristian Austin was instructed to call 911 with any severe reactions post vaccine: Marland Kitchen Difficulty breathing  . Swelling of face and throat  . A fast heartbeat  . A bad rash all over body  . Dizziness and weakness   Immunizations Administered    Name Date Dose VIS Date Route   Moderna COVID-19 Vaccine 04/10/2019  9:58 AM 0.5 mL 12/08/2018 Intramuscular   Manufacturer: Moderna   Lot: [id number]   NDC: 44171-278-71

## 2019-06-10 ENCOUNTER — Other Ambulatory Visit: Payer: Self-pay

## 2019-06-10 ENCOUNTER — Ambulatory Visit: Payer: BC Managed Care – PPO | Admitting: Family Medicine

## 2019-06-10 VITALS — BP 152/98 | HR 84 | Temp 98.0°F | Wt 177.0 lb

## 2019-06-10 DIAGNOSIS — R202 Paresthesia of skin: Secondary | ICD-10-CM | POA: Diagnosis not present

## 2019-06-10 MED ORDER — OMEPRAZOLE 20 MG PO CPDR: DELAYED_RELEASE_CAPSULE | ORAL | 11 refills | Status: DC

## 2019-06-10 MED ORDER — HYDROCHLOROTHIAZIDE 12.5 MG PO CAPS: 12.5000 mg | ORAL_CAPSULE | Freq: Every day | ORAL | 3 refills | Status: DC

## 2019-06-10 MED ORDER — SILDENAFIL CITRATE 100 MG PO TABS: 50.0000 mg | ORAL_TABLET | Freq: Every day | ORAL | 11 refills | Status: DC | PRN

## 2019-06-10 NOTE — Progress Notes (Signed)
Subjective:    Patient ID: Cristian Austin, male    DOB: 1950/04/01, 69 y.o.   MRN: [id number]  Patient presents today requesting a referral to an orthopedist.  He states that he has been waking up every night with his right arm numb.  The numbness includes the entire right arm not just the hand.  However if he wakes up and shakes his arm the numbness will go away.  He denies any pain radiating from his neck down his arm.  He denies any weakness in his arm.  He denies any pain in his hand.  He denies any neuropathic pain shooting down his arm.  He denies any recent neck pain or neck injury.  Today on exam he has a positive Tinel's sign.  He has a negative Phalen sign.  Muscle strength is 5/5 equal and symmetric in the upper extremities.  He has normal reflexes 2/4 at the biceps, triceps, and brachioradialis.  He has no tenderness to palpation in his neck and normal range of motion in his neck.  His blood pressure today is elevated however he has not been taking his blood pressure medication.  He request a refill on his blood pressure medication. Past Medical History:  Diagnosis Date   GERD (gastroesophageal reflux disease)    Hypertension    Current Outpatient Medications on File Prior to Visit  Medication Sig Dispense Refill   tiZANidine (ZANAFLEX) 4 MG tablet TAKE (1) TABLET BY MOUTH EVERY SIX HOURS AS NEEDED FOR MUSCLE SPASMS. 30 tablet 0   No current facility-administered medications on file prior to visit.   No past surgical history on file. Allergies  Allergen Reactions   Asa [Aspirin]     GI upset   Social History   Socioeconomic History   Marital status: Single    Spouse name: Not on file   Number of children: Not on file   Years of education: Not on file   Highest education level: Not on file  Occupational History   Not on file  Tobacco Use   Smoking status: Former Smoker    Quit date: 10/12/1997    Years since quitting: 21.6   Smokeless tobacco: Never Used    Substance and Sexual Activity   Alcohol use: Yes    Comment: "a beer here and there" pt states   Drug use: No   Sexual activity: Not on file  Other Topics Concern   Not on file  Social History Narrative   Not on file   Social Determinants of Health   Financial Resource Strain:    Difficulty of Paying Living Expenses:   Food Insecurity:    Worried About Charity fundraiser in the Last Year:    Arboriculturist in the Last Year:   Transportation Needs:    Film/video editor (Medical):    Lack of Transportation (Non-Medical):   Physical Activity:    Days of Exercise per Week:    Minutes of Exercise per Session:   Stress:    Feeling of Stress :   Social Connections:    Frequency of Communication with Friends and Family:    Frequency of Social Gatherings with Friends and Family:    Attends Religious Services:    Active Member of Clubs or Organizations:    Attends Archivist Meetings:    Marital Status:   Intimate Partner Violence:    Fear of Current or Ex-Partner:    Emotionally Abused:    Physically  Abused:    Sexually Abused:      Review of Systems  All other systems reviewed and are negative.      Objective:   Physical Exam  Constitutional: He is oriented to person, place, and time. He appears well-developed and well-nourished.  Neck: No tracheal deviation present.  Cardiovascular: Normal rate, regular rhythm and normal heart sounds.  No murmur heard. Pulmonary/Chest: Effort normal and breath sounds normal. No stridor. No respiratory distress. He has no wheezes. He has no rales.  Abdominal: Bowel sounds are normal.  Musculoskeletal:        General: No edema.     Right wrist: No swelling, deformity, effusion, tenderness, bony tenderness or crepitus. Normal range of motion.     Right hand: Normal. Normal strength. Normal sensation.     Cervical back: Normal range of motion and neck supple. No swelling, deformity, spasms,  tenderness, bony tenderness or pain.  Lymphadenopathy:    He has no cervical adenopathy.  Neurological: He is alert and oriented to person, place, and time. He has normal reflexes. He displays normal reflexes. No cranial nerve deficit. He exhibits normal muscle tone. Coordination normal.  Vitals reviewed.         Assessment & Plan:  Paresthesia - Plan: Nerve conduction test  Patient is having paresthesias and numbness in his right arm.  I suspect carpal tunnel syndrome.  I doubt cervical radiculopathy.  I will schedule the patient for nerve conduction test to evaluate further.  Otherwise his exam today is normal.  I do not suspect a TIA or any type of central nervous system pathology.  Await the results of his nerve conduction test and likely consult orthopedics based upon that.  Strongly encouraged the patient to resume his blood pressure medication.

## 2019-07-14 ENCOUNTER — Telehealth: Payer: Self-pay

## 2019-07-14 DIAGNOSIS — R202 Paresthesia of skin: Secondary | ICD-10-CM

## 2019-07-14 NOTE — Telephone Encounter (Signed)
-----   Message from Lysbeth Galas sent at 07/14/2019  8:58 AM EDT ----- Regarding: neurology referral Aoi Kouns,  Please put in a referral to neurology for patient to have a nerve conduction test. It looks like an order was place on 06/10/19.  Thank you, Carollee Herter

## 2019-10-05 ENCOUNTER — Ambulatory Visit: Payer: BC Managed Care – PPO | Admitting: Family Medicine

## 2019-11-11 ENCOUNTER — Ambulatory Visit: Payer: BC Managed Care – PPO | Admitting: Family Medicine

## 2019-11-11 ENCOUNTER — Other Ambulatory Visit: Payer: Self-pay

## 2019-11-11 VITALS — BP 130/80 | HR 89 | Temp 98.7°F | Ht 65.0 in | Wt 174.0 lb

## 2019-11-11 DIAGNOSIS — I10 Essential (primary) hypertension: Secondary | ICD-10-CM | POA: Diagnosis not present

## 2019-11-11 DIAGNOSIS — Z125 Encounter for screening for malignant neoplasm of prostate: Secondary | ICD-10-CM | POA: Diagnosis not present

## 2019-11-11 MED ORDER — SILDENAFIL CITRATE 100 MG PO TABS: 50.0000 mg | ORAL_TABLET | Freq: Every day | ORAL | 5 refills | Status: DC | PRN

## 2019-11-11 MED ORDER — OMEPRAZOLE 20 MG PO CPDR: DELAYED_RELEASE_CAPSULE | ORAL | 3 refills | Status: DC

## 2019-11-11 MED ORDER — HYDROCHLOROTHIAZIDE 12.5 MG PO CAPS: 12.5000 mg | ORAL_CAPSULE | Freq: Every day | ORAL | 3 refills | Status: DC

## 2019-11-11 NOTE — Progress Notes (Signed)
Subjective:    Patient ID: Cristian Austin, male    DOB: 06-24-1950, 69 y.o.   MRN: [id number]  Patient has a history of hypertension.  He is currently on hydrochlorothiazide.  He denies any chest pain shortness of breath or dyspnea on exertion.  He is not fasting today.  He was seen in the emergency room and February and at that time had an elevated random blood sugar of 122.  He denies any polyuria polydipsia or blurry vision.  He is long overdue for fasting lab work including a fasting lipid panel.  He is due to get his flu shot tomorrow.  He is already had 2 doses of the Covid vaccine.  He is overdue for prostate cancer screening. Past Medical History:  Diagnosis Date  . GERD (gastroesophageal reflux disease)   . Hypertension    Current Outpatient Medications on File Prior to Visit  Medication Sig Dispense Refill  . tiZANidine (ZANAFLEX) 4 MG tablet TAKE (1) TABLET BY MOUTH EVERY SIX HOURS AS NEEDED FOR MUSCLE SPASMS. (Patient not taking: Reported on 11/11/2019) 30 tablet 0   No current facility-administered medications on file prior to visit.   No past surgical history on file. Allergies  Allergen Reactions  . Asa [Aspirin]     GI upset   Social History   Socioeconomic History  . Marital status: Single    Spouse name: Not on file  . Number of children: Not on file  . Years of education: Not on file  . Highest education level: Not on file  Occupational History  . Not on file  Tobacco Use  . Smoking status: Former Smoker    Quit date: 10/12/1997    Years since quitting: 22.0  . Smokeless tobacco: Never Used  Vaping Use  . Vaping Use: Never used  Substance and Sexual Activity  . Alcohol use: Yes    Comment: "a beer here and there" pt states  . Drug use: No  . Sexual activity: Not on file  Other Topics Concern  . Not on file  Social History Narrative  . Not on file   Social Determinants of Health   Financial Resource Strain:   . Difficulty of Paying Living Expenses:  Not on file  Food Insecurity:   . Worried About Programme researcher, broadcasting/film/video in the Last Year: Not on file  . Ran Out of Food in the Last Year: Not on file  Transportation Needs:   . Lack of Transportation (Medical): Not on file  . Lack of Transportation (Non-Medical): Not on file  Physical Activity:   . Days of Exercise per Week: Not on file  . Minutes of Exercise per Session: Not on file  Stress:   . Feeling of Stress : Not on file  Social Connections:   . Frequency of Communication with Friends and Family: Not on file  . Frequency of Social Gatherings with Friends and Family: Not on file  . Attends Religious Services: Not on file  . Active Member of Clubs or Organizations: Not on file  . Attends Banker Meetings: Not on file  . Marital Status: Not on file  Intimate Partner Violence:   . Fear of Current or Ex-Partner: Not on file  . Emotionally Abused: Not on file  . Physically Abused: Not on file  . Sexually Abused: Not on file     Review of Systems  All other systems reviewed and are negative.      Objective:   Physical  Exam Vitals reviewed.  Constitutional:      Appearance: He is well-developed.  Cardiovascular:     Rate and Rhythm: Normal rate and regular rhythm.     Heart sounds: Normal heart sounds. No murmur heard.   Pulmonary:     Effort: Pulmonary effort is normal. No respiratory distress.     Breath sounds: Normal breath sounds. No wheezing or rales.           Assessment & Plan:  Benign essential HTN - Plan: CBC with Differential/Platelet, COMPLETE METABOLIC PANEL WITH GFR, Lipid panel  Blood pressure is now adequately controlled.  Continue to monitor blood pressure.  I have asked the patient to return fasting for a CBC, CMP, and fasting lipid panel.  I will screen for prostate cancer with a PSA.  I recommended a flu shot and a tetanus shot which he declined.

## 2019-12-23 ENCOUNTER — Ambulatory Visit: Payer: No Typology Code available for payment source | Attending: Internal Medicine

## 2019-12-23 DIAGNOSIS — Z23 Encounter for immunization: Secondary | ICD-10-CM

## 2019-12-23 NOTE — Progress Notes (Signed)
   Covid-19 Vaccination Clinic  Name:  Cristian Austin    MRN: [id number] DOB: Apr 05, 1950  12/23/2019  [name] was observed post Covid-19 immunization for 15 minutes without incident. He was provided with Vaccine Information Sheet and instruction to access the V-Safe system.   [name] was instructed to call 911 with any severe reactions post vaccine: Marland Kitchen Difficulty breathing  . Swelling of face and throat  . A fast heartbeat  . A bad rash all over body  . Dizziness and weakness   Immunizations Administered    Name Date Dose VIS Date Route   Moderna Covid-19 Booster Vaccine 12/23/2019  3:11 PM 0.25 mL 10/27/2019 Intramuscular   Manufacturer: Moderna   Lot: [id number]   NDC: 73736-681-59

## 2020-10-09 ENCOUNTER — Other Ambulatory Visit: Payer: Self-pay | Admitting: Family Medicine

## 2020-10-26 ENCOUNTER — Other Ambulatory Visit: Payer: Self-pay

## 2020-10-26 ENCOUNTER — Ambulatory Visit: Payer: BC Managed Care – PPO | Admitting: Family Medicine

## 2020-10-26 VITALS — BP 140/82 | HR 70 | Temp 98.4°F | Resp 20 | Wt 177.0 lb

## 2020-10-26 DIAGNOSIS — Z125 Encounter for screening for malignant neoplasm of prostate: Secondary | ICD-10-CM

## 2020-10-26 DIAGNOSIS — R7309 Other abnormal glucose: Secondary | ICD-10-CM | POA: Diagnosis not present

## 2020-10-26 DIAGNOSIS — I10 Essential (primary) hypertension: Secondary | ICD-10-CM | POA: Diagnosis not present

## 2020-10-26 MED ORDER — OMEPRAZOLE 20 MG PO CPDR: DELAYED_RELEASE_CAPSULE | ORAL | 3 refills | Status: DC

## 2020-10-26 MED ORDER — HYDROCHLOROTHIAZIDE 12.5 MG PO CAPS: ORAL_CAPSULE | ORAL | 3 refills | Status: DC

## 2020-10-26 MED ORDER — SILDENAFIL CITRATE 100 MG PO TABS: 50.0000 mg | ORAL_TABLET | Freq: Every day | ORAL | 5 refills | Status: DC | PRN

## 2020-10-26 NOTE — Progress Notes (Signed)
   Subjective:    Patient ID: Cristian Austin, male    DOB: August 16, 1950, 70 y.o.   MRN: [id number]  Patient has a history of hypertension.  He is currently on hydrochlorothiazide.  He denies any chest pain shortness of breath or dyspnea on exertion.  Patient is not fasting today however he is overdue to check a PSA to screen for prostate cancer, lipid panel, CBC and a CMP.  He declines a flu shot.  He continues to have occasional heartburn for which he takes omeprazole but the medication seems to help.  He also declines a COVID vaccination. Past Medical History:  Diagnosis Date   GERD (gastroesophageal reflux disease)    Hypertension    No current outpatient medications on file prior to visit.   No current facility-administered medications on file prior to visit.   No past surgical history on file. Allergies  Allergen Reactions   Asa [Aspirin]     GI upset   Social History   Socioeconomic History   Marital status: Married    Spouse name: Not on file   Number of children: Not on file   Years of education: Not on file   Highest education level: Not on file  Occupational History   Not on file  Tobacco Use   Smoking status: Former    Types: Cigarettes    Quit date: 10/12/1997    Years since quitting: 23.0   Smokeless tobacco: Never  Vaping Use   Vaping Use: Never used  Substance and Sexual Activity   Alcohol use: Yes    Comment: "a beer here and there" pt states   Drug use: No   Sexual activity: Not on file  Other Topics Concern   Not on file  Social History Narrative   Not on file   Social Determinants of Health   Financial Resource Strain: Not on file  Food Insecurity: Not on file  Transportation Needs: Not on file  Physical Activity: Not on file  Stress: Not on file  Social Connections: Not on file  Intimate Partner Violence: Not on file     Review of Systems  All other systems reviewed and are negative.     Objective:   Physical Exam Vitals reviewed.   Constitutional:      Appearance: He is well-developed.  Cardiovascular:     Rate and Rhythm: Normal rate and regular rhythm.     Heart sounds: Normal heart sounds. No murmur heard. Pulmonary:     Effort: Pulmonary effort is normal. No respiratory distress.     Breath sounds: Normal breath sounds. No wheezing or rales.          Assessment & Plan:  Benign essential HTN - Plan: CBC with Differential/Platelet, COMPLETE METABOLIC PANEL WITH GFR, Lipid panel, PSA  Prostate cancer screening - Plan: PSA  Blood pressure is now adequately controlled.  Continue to monitor blood pressure.  I will screen for prostate cancer with a PSA.  Check CBC CMP lipid panel.  If sugar is elevated I will add an A1c.  Goal LDL cholesterol is less than 100.  Recommended a flu shot which the patient declined.

## 2020-11-01 LAB — COMPLETE METABOLIC PANEL WITH GFR
AG Ratio: 1.3 (calc) (ref 1.0–2.5)
ALT: 18 U/L (ref 9–46)
AST: 14 U/L (ref 10–35)
Albumin: 4 g/dL (ref 3.6–5.1)
Alkaline phosphatase (APISO): 91 U/L (ref 35–144)
BUN: 12 mg/dL (ref 7–25)
CO2: 25 mmol/L (ref 20–32)
Calcium: 10.1 mg/dL (ref 8.6–10.3)
Chloride: 104 mmol/L (ref 98–110)
Creat: 0.91 mg/dL (ref 0.70–1.35)
Globulin: 3 g/dL (calc) (ref 1.9–3.7)
Glucose, Bld: 158 mg/dL — ABNORMAL HIGH (ref 65–99)
Potassium: 4.6 mmol/L (ref 3.5–5.3)
Sodium: 140 mmol/L (ref 135–146)
Total Bilirubin: 0.3 mg/dL (ref 0.2–1.2)
Total Protein: 7 g/dL (ref 6.1–8.1)
eGFR: 92 mL/min/{1.73_m2} (ref >=60)

## 2020-11-01 LAB — CBC WITH DIFFERENTIAL/PLATELET
Absolute Monocytes: 528 cells/uL (ref 200–950)
Basophils Absolute: 72 cells/uL (ref 0–200)
Basophils Relative: 0.9 %
Eosinophils Absolute: 392 cells/uL (ref 15–500)
Eosinophils Relative: 4.9 %
HCT: 43.7 % (ref 38.5–50.0)
Hemoglobin: 14.1 g/dL (ref 13.2–17.1)
Lymphs Abs: 2464 cells/uL (ref 850–3900)
MCH: 28.7 pg (ref 27.0–33.0)
MCHC: 32.3 g/dL (ref 32.0–36.0)
MCV: 89 fL (ref 80.0–100.0)
MPV: 10.7 fL (ref 7.5–12.5)
Monocytes Relative: 6.6 %
Neutro Abs: 4544 cells/uL (ref 1500–7800)
Neutrophils Relative %: 56.8 %
Platelets: 293 10*3/uL (ref 140–400)
RBC: 4.91 10*6/uL (ref 4.20–5.80)
RDW: 14.2 % (ref 11.0–15.0)
Total Lymphocyte: 30.8 %
WBC: 8 10*3/uL (ref 3.8–10.8)

## 2020-11-01 LAB — LIPID PANEL
Cholesterol: 183 mg/dL (ref <200)
HDL: 41 mg/dL (ref >=40)
LDL Cholesterol (Calc): 106 mg/dL (calc) — ABNORMAL HIGH
Non-HDL Cholesterol (Calc): 142 mg/dL (calc) — ABNORMAL HIGH (ref <130)
Total CHOL/HDL Ratio: 4.5 (calc) (ref <5.0)
Triglycerides: 239 mg/dL — ABNORMAL HIGH (ref <150)

## 2020-11-01 LAB — HEMOGLOBIN A1C W/OUT EAG: Hgb A1c MFr Bld: 6.5 % of total Hgb — ABNORMAL HIGH (ref <5.7)

## 2020-11-01 LAB — TEST AUTHORIZATION

## 2020-11-01 LAB — PSA: PSA: 0.3 ng/mL (ref <=4.00)

## 2020-11-02 ENCOUNTER — Other Ambulatory Visit: Payer: Self-pay | Admitting: *Deleted

## 2020-11-02 DIAGNOSIS — Z1159 Encounter for screening for other viral diseases: Secondary | ICD-10-CM

## 2020-11-02 DIAGNOSIS — Z13220 Encounter for screening for lipoid disorders: Secondary | ICD-10-CM

## 2020-11-02 DIAGNOSIS — I10 Essential (primary) hypertension: Secondary | ICD-10-CM | POA: Insufficient documentation

## 2020-11-02 DIAGNOSIS — E119 Type 2 diabetes mellitus without complications: Secondary | ICD-10-CM | POA: Insufficient documentation

## 2020-11-02 MED ORDER — ROSUVASTATIN CALCIUM 10 MG PO TABS: 10.0000 mg | ORAL_TABLET | Freq: Every day | ORAL | 3 refills | Status: DC

## 2021-02-05 ENCOUNTER — Ambulatory Visit: Payer: BC Managed Care – PPO | Admitting: Family Medicine

## 2021-02-05 ENCOUNTER — Encounter: Payer: Self-pay | Admitting: Family Medicine

## 2021-02-05 ENCOUNTER — Other Ambulatory Visit: Payer: Self-pay

## 2021-02-05 VITALS — BP 124/82 | HR 81 | Temp 97.8°F | Resp 18 | Ht 65.0 in | Wt 158.0 lb

## 2021-02-05 DIAGNOSIS — Z13220 Encounter for screening for lipoid disorders: Secondary | ICD-10-CM | POA: Diagnosis not present

## 2021-02-05 DIAGNOSIS — I10 Essential (primary) hypertension: Secondary | ICD-10-CM

## 2021-02-05 DIAGNOSIS — E119 Type 2 diabetes mellitus without complications: Secondary | ICD-10-CM | POA: Diagnosis not present

## 2021-02-05 DIAGNOSIS — Z136 Encounter for screening for cardiovascular disorders: Secondary | ICD-10-CM

## 2021-02-05 NOTE — Progress Notes (Signed)
Subjective:    Patient ID: Cristian Austin, male    DOB: [birth year], 71 y.o.   MRN: [id number]   In 10/22, A1c was 6.5 indicating diabetes mellitus 2.  Patient has been trying to monitor his sugars and eat a low carbohydrate diet.  He has lost weight since his last visit eating a low carbohydrate diet. Wt Readings from Last 3 Encounters:  02/05/21 158 lb (71.7 kg)  10/26/20 177 lb (80.3 kg)  11/11/19 174 lb (78.9 kg)   He denies any chest pain shortness of breath or dyspnea on exertion.  He has been taking Crestor due to his diagnosis of diabetes.  He has stopped drinking sugary beverages and is now drinking 0-calorie beverages or diet soda.  He denies any myalgias or right upper quadrant pain.  He denies any neuropathy in his feet.  He is overdue to see an ophthalmologist and we discussed this today but he declines referral at the present Past Medical History:  Diagnosis Date   GERD (gastroesophageal reflux disease)    Hypertension    Current Outpatient Medications on File Prior to Visit  Medication Sig Dispense Refill   hydrochlorothiazide (MICROZIDE) 12.5 MG capsule TAKE (1) CAPSULE BY MOUTH ONCE DAILY. 90 capsule 3   omeprazole (PRILOSEC) 20 MG capsule TAKE (1) CAPSULE BY MOUTH ONCE DAILY. 90 capsule 3   rosuvastatin (CRESTOR) 10 MG tablet Take 1 tablet (10 mg total) by mouth daily. 90 tablet 3   sildenafil (VIAGRA) 100 MG tablet Take 0.5-1 tablets (50-100 mg total) by mouth daily as needed for erectile dysfunction. 11 tablet 5   No current facility-administered medications on file prior to visit.   No past surgical history on file. Allergies  Allergen Reactions   Asa [Aspirin]     GI upset   Social History   Socioeconomic History   Marital status: Married    Spouse name: Not on file   Number of children: Not on file   Years of education: Not on file   Highest education level: Not on file  Occupational History   Not on file  Tobacco Use   Smoking status: Former     Types: Cigarettes    Quit date: 10/12/1997    Years since quitting: 23.3   Smokeless tobacco: Never  Vaping Use   Vaping Use: Never used  Substance and Sexual Activity   Alcohol use: Yes    Comment: "a beer here and there" pt states   Drug use: No   Sexual activity: Not on file  Other Topics Concern   Not on file  Social History Narrative   Not on file   Social Determinants of Health   Financial Resource Strain: Not on file  Food Insecurity: Not on file  Transportation Needs: Not on file  Physical Activity: Not on file  Stress: Not on file  Social Connections: Not on file  Intimate Partner Violence: Not on file     Review of Systems  All other systems reviewed and are negative.     Objective:   Physical Exam Vitals reviewed.  Constitutional:      Appearance: He is well-developed.  Cardiovascular:     Rate and Rhythm: Normal rate and regular rhythm.     Heart sounds: Normal heart sounds. No murmur heard. Pulmonary:     Effort: Pulmonary effort is normal. No respiratory distress.     Breath sounds: Normal breath sounds. No wheezing or rales.  Assessment & Plan:  Diabetes mellitus without complication (HCC) - Plan: Hemoglobin A1c, Microalbumin, urine, COMPLETE METABOLIC PANEL WITH GFR  Benign essential HTN  Encounter for lipid screening for cardiovascular disease - Plan: Lipid panel Blood pressure today is well controlled.  I will check a lipid panel.  Ideally I like to see his LDL cholesterol below 100.  I am proud of the patient for losing weight.  I believe that this is a reflection of his low carbohydrate diet.  I will check an A1c and I suspect that it will be much better.  Check a urine microalbumin.  Recommended the patient see an ophthalmologist annually for diabetic eye exam.  Diabetic foot exam was performed today and was normal

## 2021-02-08 LAB — COMPLETE METABOLIC PANEL WITH GFR
AG Ratio: 1.5 (calc) (ref 1.0–2.5)
ALT: 21 U/L (ref 9–46)
AST: 17 U/L (ref 10–35)
Albumin: 4.4 g/dL (ref 3.6–5.1)
Alkaline phosphatase (APISO): 74 U/L (ref 35–144)
BUN: 14 mg/dL (ref 7–25)
CO2: 29 mmol/L (ref 20–32)
Calcium: 9.7 mg/dL (ref 8.6–10.3)
Chloride: 106 mmol/L (ref 98–110)
Creat: 0.86 mg/dL (ref 0.70–1.35)
Globulin: 2.9 g/dL (calc) (ref 1.9–3.7)
Glucose, Bld: 94 mg/dL (ref 65–99)
Potassium: 4.2 mmol/L (ref 3.5–5.3)
Sodium: 142 mmol/L (ref 135–146)
Total Bilirubin: 0.7 mg/dL (ref 0.2–1.2)
Total Protein: 7.3 g/dL (ref 6.1–8.1)
eGFR: 94 mL/min/{1.73_m2} (ref >=60)

## 2021-02-08 LAB — HEMOGLOBIN A1C
Hgb A1c MFr Bld: 6.6 % of total Hgb — ABNORMAL HIGH (ref <5.7)
Mean Plasma Glucose: 143 mg/dL
eAG (mmol/L): 7.9 mmol/L

## 2021-02-08 LAB — MICROALBUMIN, URINE: Microalb, Ur: 0.7 mg/dL

## 2021-09-03 ENCOUNTER — Ambulatory Visit: Payer: Self-pay | Admitting: Family Medicine

## 2021-09-25 LAB — HM DIABETES EYE EXAM

## 2021-10-11 ENCOUNTER — Encounter: Payer: Self-pay | Admitting: Family Medicine
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
## 2021-12-10 ENCOUNTER — Other Ambulatory Visit: Payer: Self-pay | Admitting: Family Medicine

## 2021-12-12 ENCOUNTER — Other Ambulatory Visit: Payer: Self-pay | Admitting: Family Medicine
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
## 2022-02-08 ENCOUNTER — Encounter: Payer: Self-pay | Admitting: Family Medicine

## 2022-02-08 ENCOUNTER — Ambulatory Visit: Payer: BC Managed Care – PPO | Admitting: Family Medicine

## 2022-02-08 VITALS — BP 126/82 | HR 75 | Temp 98.3°F | Ht 65.0 in | Wt 170.0 lb

## 2022-02-08 DIAGNOSIS — I10 Essential (primary) hypertension: Secondary | ICD-10-CM

## 2022-02-08 DIAGNOSIS — E119 Type 2 diabetes mellitus without complications: Secondary | ICD-10-CM

## 2022-02-08 MED ORDER — SILDENAFIL CITRATE 100 MG PO TABS: ORAL_TABLET | ORAL | 5 refills | Status: DC

## 2022-02-08 MED ORDER — ROSUVASTATIN CALCIUM 10 MG PO TABS: 10.0000 mg | ORAL_TABLET | Freq: Every day | ORAL | 3 refills | Status: DC

## 2022-02-08 MED ORDER — HYDROCHLOROTHIAZIDE 12.5 MG PO CAPS: 12.5000 mg | ORAL_CAPSULE | Freq: Every day | ORAL | 3 refills | Status: DC

## 2022-02-08 MED ORDER — OMEPRAZOLE 20 MG PO CPDR: DELAYED_RELEASE_CAPSULE | ORAL | 3 refills | Status: DC

## 2022-02-08 NOTE — Progress Notes (Signed)
Subjective:    Patient ID: Cristian Austin, male    DOB: 1950-02-12, 72 y.o.   MRN: [id number] History of borderline diabetes.  He is here today to recheck his fasting lab work.  His blood pressure today is outstanding.  He is taking hydrochlorothiazide for hypertension.  He is on rosuvastatin for hyperlipidemia.  He uses omeprazole intermittently for reflux as well as Viagra as needed for erectile dysfunction.  He denies any chest pain shortness of breath or dyspnea on exertion.  He is overdue for a flu shot.  He is also due for the pneumonia vaccine as well as the shingles vaccine.  He declines all vaccinations today. Past Medical History:  Diagnosis Date   GERD (gastroesophageal reflux disease)    Hypertension     Current Outpatient Medications:    hydrochlorothiazide (MICROZIDE) 12.5 MG capsule, Take 1 capsule (12.5 mg total) by mouth daily., Disp: 90 capsule, Rfl: 3   omeprazole (PRILOSEC) 20 MG capsule, TAKE (1) CAPSULE BY MOUTH ONCE DAILY., Disp: 90 capsule, Rfl: 3   rosuvastatin (CRESTOR) 10 MG tablet, Take 1 tablet (10 mg total) by mouth daily., Disp: 90 tablet, Rfl: 3   sildenafil (VIAGRA) 100 MG tablet, TAKE 1/2 TO 1 TABLET BY MOUTH DAILY AS NEEDED, Disp: 5 tablet, Rfl: 5    No past surgical history on file. Allergies  Allergen Reactions   Asa [Aspirin]     GI upset   Social History   Socioeconomic History   Marital status: Married    Spouse name: Not on file   Number of children: Not on file   Years of education: Not on file   Highest education level: Not on file  Occupational History   Not on file  Tobacco Use   Smoking status: Former    Types: Cigarettes    Quit date: 10/12/1997    Years since quitting: 24.3   Smokeless tobacco: Never  Vaping Use   Vaping Use: Never used  Substance and Sexual Activity   Alcohol use: Yes    Comment: "a beer here and there" pt states   Drug use: No   Sexual activity: Not on file  Other Topics Concern   Not on file  Social  History Narrative   Not on file   Social Determinants of Health   Financial Resource Strain: Not on file  Food Insecurity: Not on file  Transportation Needs: Not on file  Physical Activity: Not on file  Stress: Not on file  Social Connections: Not on file  Intimate Partner Violence: Not on file     Review of Systems  All other systems reviewed and are negative.      Objective:   Physical Exam Vitals reviewed.  Constitutional:      Appearance: He is well-developed.  Cardiovascular:     Rate and Rhythm: Normal rate and regular rhythm.     Heart sounds: Normal heart sounds. No murmur heard. Pulmonary:     Effort: Pulmonary effort is normal. No respiratory distress.     Breath sounds: Normal breath sounds. No wheezing or rales.           Assessment & Plan:  Benign essential HTN - Plan: CBC with Differential/Platelet, Lipid panel, COMPLETE METABOLIC PANEL WITH GFR  Diabetes mellitus without complication (HCC) - Plan: Hemoglobin A1c  Blood pressure today is well controlled.  I will check a lipid panel.  Ideally I like to see his LDL cholesterol below 100.  Patient's last hemoglobin A1c was  6.6 in January 2023.  He is due to recheck that today so I will get a hemoglobin A1c.  Ideally I like to see his A1c less than 6.5.  Strongly recommended a pneumonia vaccine as well as a flu shot but the patient politely declined.

## 2022-02-09 LAB — COMPLETE METABOLIC PANEL WITH GFR
AG Ratio: 1.5 (calc) (ref 1.0–2.5)
ALT: 24 U/L (ref 9–46)
AST: 17 U/L (ref 10–35)
Albumin: 4.4 g/dL (ref 3.6–5.1)
Alkaline phosphatase (APISO): 67 U/L (ref 35–144)
BUN: 14 mg/dL (ref 7–25)
CO2: 25 mmol/L (ref 20–32)
Calcium: 9.9 mg/dL (ref 8.6–10.3)
Chloride: 104 mmol/L (ref 98–110)
Creat: 0.79 mg/dL (ref 0.70–1.28)
Globulin: 3 g/dL (calc) (ref 1.9–3.7)
Glucose, Bld: 105 mg/dL — ABNORMAL HIGH (ref 65–99)
Potassium: 4.2 mmol/L (ref 3.5–5.3)
Sodium: 140 mmol/L (ref 135–146)
Total Bilirubin: 0.4 mg/dL (ref 0.2–1.2)
Total Protein: 7.4 g/dL (ref 6.1–8.1)
eGFR: 96 mL/min/{1.73_m2} (ref >=60)

## 2022-02-09 LAB — LIPID PANEL
Cholesterol: 133 mg/dL (ref <200)
HDL: 45 mg/dL (ref >=40)
LDL Cholesterol (Calc): 67 mg/dL (calc)
Non-HDL Cholesterol (Calc): 88 mg/dL (calc) (ref <130)
Total CHOL/HDL Ratio: 3 (calc) (ref <5.0)
Triglycerides: 125 mg/dL (ref <150)

## 2022-02-09 LAB — CBC WITH DIFFERENTIAL/PLATELET
Absolute Monocytes: 810 cells/uL (ref 200–950)
Basophils Absolute: 73 cells/uL (ref 0–200)
Basophils Relative: 0.8 %
Eosinophils Absolute: 428 cells/uL (ref 15–500)
Eosinophils Relative: 4.7 %
HCT: 44.2 % (ref 38.5–50.0)
Hemoglobin: 14.7 g/dL (ref 13.2–17.1)
Lymphs Abs: 2503 cells/uL (ref 850–3900)
MCH: 28.7 pg (ref 27.0–33.0)
MCHC: 33.3 g/dL (ref 32.0–36.0)
MCV: 86.3 fL (ref 80.0–100.0)
MPV: 10.9 fL (ref 7.5–12.5)
Monocytes Relative: 8.9 %
Neutro Abs: 5287 cells/uL (ref 1500–7800)
Neutrophils Relative %: 58.1 %
Platelets: 297 10*3/uL (ref 140–400)
RBC: 5.12 10*6/uL (ref 4.20–5.80)
RDW: 13.5 % (ref 11.0–15.0)
Total Lymphocyte: 27.5 %
WBC: 9.1 10*3/uL (ref 3.8–10.8)

## 2022-02-09 LAB — HEMOGLOBIN A1C
Hgb A1c MFr Bld: 6.8 % of total Hgb — ABNORMAL HIGH (ref <5.7)
Mean Plasma Glucose: 148 mg/dL
eAG (mmol/L): 8.2 mmol/L

## 2022-02-09 LAB — EXTRA SPECIMEN

## 2022-02-11 ENCOUNTER — Encounter: Payer: Self-pay | Admitting: Family Medicine

## 2022-02-12 ENCOUNTER — Other Ambulatory Visit: Payer: Self-pay

## 2022-02-12 MED ORDER — METFORMIN HCL ER 500 MG PO TB24: 1000.0000 mg | ORAL_TABLET | Freq: Every day | ORAL | 3 refills | Status: DC

## 2022-02-14 ENCOUNTER — Telehealth: Payer: Self-pay

## 2022-02-14 NOTE — Telephone Encounter (Signed)
LVM for to pt return re: med concern.

## 2022-02-14 NOTE — Telephone Encounter (Signed)
Pt called this morning re: metformin. Per pt stated that there is too many side effects ie kidneys and he does not want to take it.   Is there an alternative? Pls advise

## 2022-02-15 ENCOUNTER — Other Ambulatory Visit: Payer: Self-pay

## 2022-02-15 MED ORDER — EMPAGLIFLOZIN 25 MG PO TABS: 25.0000 mg | ORAL_TABLET | Freq: Every day | ORAL | 3 refills | Status: AC

## 2022-02-15 NOTE — Telephone Encounter (Signed)
Spoke w/pt, per pcp's recommendations"replace metformin w/Jardiance 67m.  Per pt voiced understanding and nothing further.

## 2022-04-05 ENCOUNTER — Ambulatory Visit: Payer: BC Managed Care – PPO

## 2022-10-11 ENCOUNTER — Other Ambulatory Visit: Payer: Self-pay | Admitting: Family Medicine

## 2022-10-11 DIAGNOSIS — Z1212 Encounter for screening for malignant neoplasm of rectum: Secondary | ICD-10-CM

## 2022-10-11 DIAGNOSIS — Z1211 Encounter for screening for malignant neoplasm of colon: Secondary | ICD-10-CM

## 2022-11-27 ENCOUNTER — Telehealth: Payer: Self-pay

## 2022-11-27 ENCOUNTER — Other Ambulatory Visit: Payer: Self-pay

## 2022-11-27 DIAGNOSIS — I10 Essential (primary) hypertension: Secondary | ICD-10-CM

## 2022-11-27 DIAGNOSIS — Z13220 Encounter for screening for lipoid disorders: Secondary | ICD-10-CM

## 2022-11-27 MED ORDER — ROSUVASTATIN CALCIUM 10 MG PO TABS: 10.0000 mg | ORAL_TABLET | Freq: Every day | ORAL | 0 refills | Status: DC

## 2022-11-27 MED ORDER — HYDROCHLOROTHIAZIDE 12.5 MG PO CAPS: 12.5000 mg | ORAL_CAPSULE | Freq: Every day | ORAL | 0 refills | Status: DC

## 2022-11-27 NOTE — Telephone Encounter (Signed)
Copied from CRM [id number]. Topic: Clinical - Medication Refill >> Nov 27, 2022  9:47 AM Theodis Sato wrote: Most Recent Primary Care Visit:  Provider: Lynnea Ferrier T  Department: [facility]  Visit Type: OFFICE VISIT  Date: 02/08/2022  Medication: hydrochlorothiazide (MICROZIDE) 12.5 MG capsule / rosuvastatin (CRESTOR) 10 MG tablet  Has the patient contacted their pharmacy? New Pharmacy  (Agent: If no, request that the patient contact the pharmacy for the refill. If patient does not wish to contact the pharmacy document the reason why and proceed with request.) (Agent: If yes, when and what did the pharmacy advise?)  Is this the correct pharmacy for this prescription? Yes If no, delete pharmacy and type the correct one.  This is the patient's preferred pharmacy:   [id number] - [place], [place] - [address] AT [facility] [address] [place] [address] Phone: [phone] Fax: [phone]   Has the prescription been filled recently? No  Is the patient out of the medication? YES  Has the patient been seen for an appointment in the last year OR does the patient have an upcoming appointment? Yes  Can we respond through MyChart? No  Agent: Please be advised that Rx refills may take up to 3 business days. We ask that you follow-up with your pharmacy.

## 2022-12-24 ENCOUNTER — Other Ambulatory Visit: Payer: Self-pay | Admitting: Family Medicine

## 2022-12-24 DIAGNOSIS — I10 Essential (primary) hypertension: Secondary | ICD-10-CM
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
## 2022-12-25 ENCOUNTER — Other Ambulatory Visit: Payer: Self-pay | Admitting: Family Medicine

## 2022-12-25 DIAGNOSIS — Z13220 Encounter for screening for lipoid disorders: Secondary | ICD-10-CM

## 2023-01-06 ENCOUNTER — Ambulatory Visit (INDEPENDENT_AMBULATORY_CARE_PROVIDER_SITE_OTHER): Payer: Medicare Other | Admitting: Family Medicine

## 2023-01-06 ENCOUNTER — Encounter: Payer: Self-pay | Admitting: Family Medicine

## 2023-01-06 ENCOUNTER — Other Ambulatory Visit: Payer: Self-pay

## 2023-01-06 VITALS — BP 164/90 | HR 91 | Temp 98.2°F | Ht 65.0 in | Wt 163.5 lb

## 2023-01-06 DIAGNOSIS — G4452 New daily persistent headache (NDPH): Secondary | ICD-10-CM | POA: Diagnosis not present

## 2023-01-06 DIAGNOSIS — I10 Essential (primary) hypertension: Secondary | ICD-10-CM

## 2023-01-06 MED ORDER — AMLODIPINE BESYLATE 10 MG PO TABS: 10.0000 mg | ORAL_TABLET | Freq: Every day | ORAL | 3 refills | Status: DC

## 2023-01-06 MED ORDER — AMLODIPINE BESYLATE 10 MG PO TABS: 10.0000 mg | ORAL_TABLET | Freq: Every day | ORAL | 3 refills | Status: AC

## 2023-01-06 NOTE — Progress Notes (Signed)
Subjective:    Patient ID: Cristian Austin, male    DOB: [birth year], 72 y.o.   MRN: [id number]  Patient has a 4-day history of a severe headache.  The headache is located on the right and left temple.  The patient states it is throbbing like her to get a.  He denies any blurry vision today.  Denies any vision changes.  Denies any nausea vomiting.  Denies any head trauma.  He denies any neurologic deficit.  I checked the blood pressure in his left arm and found it to be 164/90.  I checked the blood pressure in his right arm and I found to be 154/90. Past Medical History:  Diagnosis Date   Diabetes mellitus type 2 with complications (HCC)    GERD (gastroesophageal reflux disease)    Hypertension     Current Outpatient Medications:    amLODipine (NORVASC) 10 MG tablet, Take 1 tablet (10 mg total) by mouth daily., Disp: 90 tablet, Rfl: 3   empagliflozin (JARDIANCE) 25 MG TABS tablet, Take 1 tablet (25 mg total) by mouth daily before breakfast., Disp: 30 tablet, Rfl: 3   hydrochlorothiazide (MICROZIDE) 12.5 MG capsule, Take 1 capsule (12.5 mg total) by mouth daily., Disp: 30 capsule, Rfl: 0   omeprazole (PRILOSEC) 20 MG capsule, TAKE (1) CAPSULE BY MOUTH ONCE DAILY., Disp: 90 capsule, Rfl: 3   rosuvastatin (CRESTOR) 10 MG tablet, TAKE 1 TABLET BY MOUTH EVERY DAY, Disp: 30 tablet, Rfl: 0   sildenafil (VIAGRA) 100 MG tablet, TAKE 1/2 TO 1 TABLET BY MOUTH DAILY AS NEEDED, Disp: 5 tablet, Rfl: 5    History reviewed. No pertinent surgical history. Allergies  Allergen Reactions   Asa [Aspirin]     GI upset   Social History   Socioeconomic History   Marital status: Married    Spouse name: Not on file   Number of children: Not on file   Years of education: Not on file   Highest education level: Not on file  Occupational History   Not on file  Tobacco Use   Smoking status: Former    Current packs/day: 0.00    Types: Cigarettes    Quit date: 10/12/1997    Years since quitting: 25.2    Smokeless tobacco: Never  Vaping Use   Vaping status: Never Used  Substance and Sexual Activity   Alcohol use: Yes    Comment: "a beer here and there" pt states   Drug use: No   Sexual activity: Not on file  Other Topics Concern   Not on file  Social History Narrative   Not on file   Social Drivers of Health   Financial Resource Strain: Not on file  Food Insecurity: Not on file  Transportation Needs: Not on file  Physical Activity: Not on file  Stress: Not on file  Social Connections: Not on file  Intimate Partner Violence: Not on file     Review of Systems  All other systems reviewed and are negative.      Objective:   Physical Exam Vitals reviewed.  Constitutional:      Appearance: He is well-developed.  HENT:     Right Ear: Tympanic membrane and ear canal normal.     Left Ear: Tympanic membrane and ear canal normal.  Eyes:     General: No visual field deficit. Cardiovascular:     Rate and Rhythm: Normal rate and regular rhythm.     Heart sounds: Normal heart sounds. No murmur heard. Pulmonary:  Effort: Pulmonary effort is normal. No respiratory distress.     Breath sounds: Normal breath sounds. No wheezing or rales.  Neurological:     Cranial Nerves: No cranial nerve deficit, dysarthria or facial asymmetry.     Sensory: No sensory deficit.     Coordination: Coordination normal.           Assessment & Plan:  New daily persistent headache I am concerned that this could be due to elevations in his blood pressure.  They are much higher than normal for this patient.  I recommended starting amlodipine 10 mg a day.  The other possibility would be aneurysm, migraines, trigeminal neuralgia, temporal arteritis.  I recommended lowering the blood pressure.  If the headache worsens I want him to go to the emergency room for imaging of the brain.  Patient agrees with this plan

## 2023-02-12 ENCOUNTER — Other Ambulatory Visit: Payer: Self-pay | Admitting: Family Medicine

## 2023-02-12 DIAGNOSIS — I10 Essential (primary) hypertension: Secondary | ICD-10-CM

## 2023-02-12 NOTE — Telephone Encounter (Signed)
 Copied from CRM [id number]. Topic: Clinical - Medication Refill >> Feb 12, 2023  8:27 AM Arlina R wrote: Most Recent Primary Care Visit:  Provider: DUANNE LOWERS T  Department: [facility]  Visit Type: ACUTE  Date: 01/06/2023  Medication:  sildenafil  (VIAGRA ) 100 MG tablet hydrochlorothiazide  (MICROZIDE ) 12.5 MG capsule  Has the patient contacted their pharmacy? Yes (Agent: If no, request that the patient contact the pharmacy for the refill. If patient does not wish to contact the pharmacy document the reason why and proceed with request.) (Agent: If yes, when and what did the pharmacy advise?)  Is this the correct pharmacy for this prescription? Yes If no, delete pharmacy and type the correct one.  This is the patient's preferred pharmacy:  [id number] [place], [place] -  [address] AT [facility] [address] [place] [address] Phone: [phone] Fax: [phone]   Has the prescription been filled recently? Yes  Is the patient out of the medication? Yes  Has the patient been seen for an appointment in the last year OR does the patient have an upcoming appointment? Yes  Can we respond through MyChart? Yes  Agent: Please be advised that Rx refills may take up to 3 business days. We ask that you follow-up with your pharmacy.
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
## 2023-02-14 ENCOUNTER — Telehealth: Payer: Self-pay | Admitting: Family Medicine

## 2023-02-14 ENCOUNTER — Other Ambulatory Visit: Payer: Self-pay

## 2023-02-14 DIAGNOSIS — E119 Type 2 diabetes mellitus without complications: Secondary | ICD-10-CM

## 2023-02-14 DIAGNOSIS — I10 Essential (primary) hypertension: Secondary | ICD-10-CM

## 2023-02-14 MED ORDER — SILDENAFIL CITRATE 100 MG PO TABS: ORAL_TABLET | ORAL | 5 refills | Status: AC

## 2023-02-14 NOTE — Telephone Encounter (Signed)
 Prescription Request  02/14/2023  LOV: 01/06/2023  What is the name of the medication or equipment?   sildenafil  (VIAGRA ) 100 MG tablet  **patient out of pills; [facility]  Have you contacted your pharmacy to request a refill? Yes   Which pharmacy would you like this sent to?  [id number] [place], [place] - [address] AT [facility] [address] [place] [address] Phone: [phone] Fax: [phone]    Patient notified that their request is being sent to the clinical staff for review and that they should receive a response within 2 business days.   Please advise pharmacist.

## 2023-03-18 ENCOUNTER — Other Ambulatory Visit: Payer: Self-pay | Admitting: Family Medicine

## 2023-03-18 DIAGNOSIS — I10 Essential (primary) hypertension: Secondary | ICD-10-CM
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
## 2023-04-17 ENCOUNTER — Other Ambulatory Visit: Payer: Self-pay

## 2023-04-17 DIAGNOSIS — Z13220 Encounter for screening for lipoid disorders: Secondary | ICD-10-CM

## 2023-04-17 MED ORDER — ROSUVASTATIN CALCIUM 10 MG PO TABS: 10.0000 mg | ORAL_TABLET | Freq: Every day | ORAL | 0 refills | Status: DC

## 2023-04-17 NOTE — Telephone Encounter (Signed)
 Prescription Request  04/17/2023  LOV: 02/08/22  What is the name of the medication or equipment? rosuvastatin (CRESTOR) 10 MG tablet [[id number]]   Have you contacted your pharmacy to request a refill? Yes   Which pharmacy would you like this sent to?  [id number] [place], [place] - [address] AT [facility] [address] [place] [address] Phone: [phone] Fax: [phone]    Patient notified that their request is being sent to the clinical staff for review and that they should receive a response within 2 business days.   Please advise at [facility] [phone]

## 2023-04-17 NOTE — Telephone Encounter (Signed)
 Requested Prescriptions  Pending Prescriptions Disp Refills   rosuvastatin (CRESTOR) 10 MG tablet 90 tablet 0    Sig: Take 1 tablet (10 mg total) by mouth daily.     Cardiovascular:  Antilipid - Statins 2 Failed - 04/17/2023  4:19 PM      Failed - Cr in normal range and within 360 days    Creat  Date Value Ref Range Status  02/08/2022 0.79 0.70 - 1.28 mg/dL Final         Failed - Valid encounter within last 12 months    Recent Outpatient Visits           3 months ago New daily persistent headache   [facility] Pickard, Priscille Heidelberg, MD   1 year ago Benign essential HTN   [place] [facility] Donita Brooks, MD              Failed - Lipid Panel in normal range within the last 12 months    Cholesterol  Date Value Ref Range Status  02/08/2022 133 <200 mg/dL Final   LDL Cholesterol (Calc)  Date Value Ref Range Status  02/08/2022 67 mg/dL (calc) Final    Comment:    Reference range: <100 . Desirable range <100 mg/dL for primary prevention;   <70 mg/dL for patients with CHD or diabetic patients  with > or = 2 CHD risk factors. Marland Kitchen LDL-C is now calculated using the Martin-Hopkins  calculation, which is a validated novel method providing  better accuracy than the Friedewald equation in the  estimation of LDL-C.  Horald Pollen et al. Lenox Ahr. 1610;960(45): 2061-2068  (http://education.[facility].com/faq/FAQ164)    Direct LDL  Date Value Ref Range Status  02/28/2017 126 (H) <100 mg/dL Final    Comment:    Greatly elevated Triglycerides values (>1200 mg/dL) interfere with the dLDL assay. As no Triglycerides  testing was ordered, interpret results with caution. . Desirable range <100 mg/dL for primary prevention;   <70 mg/dL for patients with CHD or diabetic patients  with > or = 2 CHD risk factors. Marland Kitchen    HDL  Date Value Ref Range Status  02/08/2022 45 > OR = 40 mg/dL Final   Triglycerides  Date Value Ref Range Status   02/08/2022 125 <150 mg/dL Final         Passed - Patient is not pregnant

## 2023-05-28 ENCOUNTER — Ambulatory Visit

## 2023-07-26 ENCOUNTER — Other Ambulatory Visit: Payer: Self-pay | Admitting: Family Medicine

## 2023-07-26 DIAGNOSIS — Z13220 Encounter for screening for lipoid disorders: Secondary | ICD-10-CM

## 2023-07-29 NOTE — Telephone Encounter (Signed)
 Last OV 12/24, OV needed for additional refills.  Requested Prescriptions  Pending Prescriptions Disp Refills   rosuvastatin  (CRESTOR ) 10 MG tablet [Pharmacy Med Name: ROSUVASTATIN  CALCIUM  10 MG TAB] 90 tablet 0    Sig: TAKE 1 TABLET BY MOUTH EVERY DAY     Cardiovascular:  Antilipid - Statins 2 Failed - 07/29/2023 10:51 AM      Failed - Cr in normal range and within 360 days    Creat  Date Value Ref Range Status  02/08/2022 0.79 0.70 - 1.28 mg/dL Final         Failed - Valid encounter within last 12 months    Recent Outpatient Visits           6 months ago New daily persistent headache   [place] [facility] Pickard, Butler DASEN, MD   1 year ago Benign essential HTN   [facility] [facility] Duanne Butler DASEN, MD              Failed - Lipid Panel in normal range within the last 12 months    Cholesterol  Date Value Ref Range Status  02/08/2022 133 <200 mg/dL Final   LDL Cholesterol (Calc)  Date Value Ref Range Status  02/08/2022 67 mg/dL (calc) Final    Comment:    Reference range: <100 . Desirable range <100 mg/dL for primary prevention;   <70 mg/dL for patients with CHD or diabetic patients  with > or = 2 CHD risk factors. SABRA LDL-C is now calculated using the Martin-Hopkins  calculation, which is a validated novel method providing  better accuracy than the Friedewald equation in the  estimation of LDL-C.  Gladis APPLETHWAITE et al. SANDREA. 7986;689(80): 2061-2068  (http://education.[facility].com/faq/FAQ164)    Direct LDL  Date Value Ref Range Status  02/28/2017 126 (H) <100 mg/dL Final    Comment:    Greatly elevated Triglycerides values (>1200 mg/dL) interfere with the dLDL assay. As no Triglycerides  testing was ordered, interpret results with caution. . Desirable range <100 mg/dL for primary prevention;   <70 mg/dL for patients with CHD or diabetic patients  with > or = 2 CHD risk factors. SABRA    HDL  Date Value Ref Range  Status  02/08/2022 45 > OR = 40 mg/dL Final   Triglycerides  Date Value Ref Range Status  02/08/2022 125 <150 mg/dL Final         Passed - Patient is not pregnant

## 2023-09-17 ENCOUNTER — Ambulatory Visit

## 2023-10-31 ENCOUNTER — Other Ambulatory Visit: Payer: Self-pay

## 2023-10-31 DIAGNOSIS — Z13220 Encounter for screening for lipoid disorders: Secondary | ICD-10-CM

## 2023-10-31 NOTE — Telephone Encounter (Signed)
 Prescription Request  10/31/2023  LOV: 01/06/23  What is the name of the medication or equipment? rosuvastatin  (CRESTOR ) 10 MG tablet [[id number]]   Have you contacted your pharmacy to request a refill? Yes   Which pharmacy would you like this sent to?  [id number] [place], [place] - [address] AT [facility] [address] [place] [address] Phone: [phone] Fax: [phone]    Patient notified that their request is being sent to the clinical staff for review and that they should receive a response within 2 business days.   Please advise at [facility] [phone]

## 2023-11-01 NOTE — Telephone Encounter (Signed)
 Requested medication (s) are due for refill today: Yes  Requested medication (s) are on the active medication list: Yes  Last refill:  07/29/23  Future visit scheduled: No  Notes to clinic:  Unable to refill per protocol, appointment needed, no update labs     Requested Prescriptions  Pending Prescriptions Disp Refills   rosuvastatin  (CRESTOR ) 10 MG tablet 90 tablet 0    Sig: Take 1 tablet (10 mg total) by mouth daily.     Cardiovascular:  Antilipid - Statins 2 Failed - 11/01/2023  9:14 PM      Failed - Cr in normal range and within 360 days    Creat  Date Value Ref Range Status  02/08/2022 0.79 0.70 - 1.28 mg/dL Final         Failed - Valid encounter within last 12 months    Recent Outpatient Visits           9 months ago New daily persistent headache   [facility] [facility] Pickard, Butler DASEN, MD   1 year ago Benign essential HTN   [place] [facility] Duanne Butler DASEN, MD              Failed - Lipid Panel in normal range within the last 12 months    Cholesterol  Date Value Ref Range Status  02/08/2022 133 <200 mg/dL Final   LDL Cholesterol (Calc)  Date Value Ref Range Status  02/08/2022 67 mg/dL (calc) Final    Comment:    Reference range: <100 . Desirable range <100 mg/dL for primary prevention;   <70 mg/dL for patients with CHD or diabetic patients  with > or = 2 CHD risk factors. SABRA LDL-C is now calculated using the Martin-Hopkins  calculation, which is a validated novel method providing  better accuracy than the Friedewald equation in the  estimation of LDL-C.  Gladis APPLETHWAITE et al. SANDREA. 7986;689(80): 2061-2068  (http://education.[facility].com/faq/FAQ164)    Direct LDL  Date Value Ref Range Status  02/28/2017 126 (H) <100 mg/dL Final    Comment:    Greatly elevated Triglycerides values (>1200 mg/dL) interfere with the dLDL assay. As no Triglycerides  testing was ordered, interpret results with  caution. . Desirable range <100 mg/dL for primary prevention;   <70 mg/dL for patients with CHD or diabetic patients  with > or = 2 CHD risk factors. SABRA    HDL  Date Value Ref Range Status  02/08/2022 45 > OR = 40 mg/dL Final   Triglycerides  Date Value Ref Range Status  02/08/2022 125 <150 mg/dL Final         Passed - Patient is not pregnant

## 2023-12-01 ENCOUNTER — Other Ambulatory Visit: Payer: Self-pay | Admitting: Family Medicine

## 2023-12-01 DIAGNOSIS — Z13220 Encounter for screening for lipoid disorders: Secondary | ICD-10-CM

## 2023-12-01 NOTE — Telephone Encounter (Unsigned)
 Copied from CRM [id number]. Topic: Clinical - Medication Refill >> Dec 01, 2023  9:48 AM Larissa S wrote: Medication: rosuvastatin  (CRESTOR ) 10 MG tablet  Has the patient contacted their pharmacy? Yes (Agent: If no, request that the patient contact the pharmacy for the refill. If patient does not wish to contact the pharmacy document the reason why and proceed with request.) (Agent: If yes, when and what did the pharmacy advise?)  This is the patient's preferred pharmacy:  [id number] [place], [place] - [address] AT [facility] [address] [place] [address] Phone: [phone] Fax: [phone]  Is this the correct pharmacy for this prescription? Yes If no, delete pharmacy and type the correct one.   Has the prescription been filled recently? No  Is the patient out of the medication? Yes  Has the patient been seen for an appointment in the last year OR does the patient have an upcoming appointment? No  Can we respond through MyChart? No  Agent: Please be advised that Rx refills may take up to 3 business days. We ask that you follow-up with your pharmacy.

## 2023-12-02 NOTE — Telephone Encounter (Signed)
 Requested medication (s) are due for refill today - yes  Requested medication (s) are on the active medication list -yes  Future visit scheduled -yes  Last refill: 07/29/23 #90  Notes to clinic: fails lab protocol- over 1 year- 02/08/22  Requested Prescriptions  Pending Prescriptions Disp Refills   rosuvastatin  (CRESTOR ) 10 MG tablet 90 tablet 0    Sig: Take 1 tablet (10 mg total) by mouth daily.     Cardiovascular:  Antilipid - Statins 2 Failed - 12/02/2023 11:53 AM      Failed - Cr in normal range and within 360 days    Creat  Date Value Ref Range Status  02/08/2022 0.79 0.70 - 1.28 mg/dL Final         Failed - Valid encounter within last 12 months    Recent Outpatient Visits           11 months ago New daily persistent headache   [facility] Pickard, Butler DASEN, MD   1 year ago Benign essential HTN   [place] [facility] Duanne Butler DASEN, MD              Failed - Lipid Panel in normal range within the last 12 months    Cholesterol  Date Value Ref Range Status  02/08/2022 133 <200 mg/dL Final   LDL Cholesterol (Calc)  Date Value Ref Range Status  02/08/2022 67 mg/dL (calc) Final    Comment:    Reference range: <100 . Desirable range <100 mg/dL for primary prevention;   <70 mg/dL for patients with CHD or diabetic patients  with > or = 2 CHD risk factors. SABRA LDL-C is now calculated using the Martin-Hopkins  calculation, which is a validated novel method providing  better accuracy than the Friedewald equation in the  estimation of LDL-C.  Gladis APPLETHWAITE et al. SANDREA. 7986;689(80): 2061-2068  (http://education.[facility].com/faq/FAQ164)    Direct LDL  Date Value Ref Range Status  02/28/2017 126 (H) <100 mg/dL Final    Comment:    Greatly elevated Triglycerides values (>1200 mg/dL) interfere with the dLDL assay. As no Triglycerides  testing was ordered, interpret results with caution. . Desirable range <100  mg/dL for primary prevention;   <70 mg/dL for patients with CHD or diabetic patients  with > or = 2 CHD risk factors. SABRA    HDL  Date Value Ref Range Status  02/08/2022 45 > OR = 40 mg/dL Final   Triglycerides  Date Value Ref Range Status  02/08/2022 125 <150 mg/dL Final         Passed - Patient is not pregnant         Requested Prescriptions  Pending Prescriptions Disp Refills   rosuvastatin  (CRESTOR ) 10 MG tablet 90 tablet 0    Sig: Take 1 tablet (10 mg total) by mouth daily.     Cardiovascular:  Antilipid - Statins 2 Failed - 12/02/2023 11:53 AM      Failed - Cr in normal range and within 360 days    Creat  Date Value Ref Range Status  02/08/2022 0.79 0.70 - 1.28 mg/dL Final         Failed - Valid encounter within last 12 months    Recent Outpatient Visits           11 months ago New daily persistent headache   [place] [facility] Pickard, Butler DASEN, MD   1 year ago Benign essential HTN  [place] Pickard, Butler DASEN, MD              Failed - Lipid Panel in normal range within the last 12 months    Cholesterol  Date Value Ref Range Status  02/08/2022 133 <200 mg/dL Final   LDL Cholesterol (Calc)  Date Value Ref Range Status  02/08/2022 67 mg/dL (calc) Final    Comment:    Reference range: <100 . Desirable range <100 mg/dL for primary prevention;   <70 mg/dL for patients with CHD or diabetic patients  with > or = 2 CHD risk factors. SABRA LDL-C is now calculated using the Martin-Hopkins  calculation, which is a validated novel method providing  better accuracy than the Friedewald equation in the  estimation of LDL-C.  Gladis APPLETHWAITE et al. SANDREA. 7986;689(80): 2061-2068  (http://education.[facility].com/faq/FAQ164)    Direct LDL  Date Value Ref Range Status  02/28/2017 126 (H) <100 mg/dL Final    Comment:    Greatly elevated Triglycerides values (>1200 mg/dL) interfere with the dLDL assay. As  no Triglycerides  testing was ordered, interpret results with caution. . Desirable range <100 mg/dL for primary prevention;   <70 mg/dL for patients with CHD or diabetic patients  with > or = 2 CHD risk factors. SABRA    HDL  Date Value Ref Range Status  02/08/2022 45 > OR = 40 mg/dL Final   Triglycerides  Date Value Ref Range Status  02/08/2022 125 <150 mg/dL Final         Passed - Patient is not pregnant

## 2023-12-03 MED ORDER — ROSUVASTATIN CALCIUM 10 MG PO TABS: 10.0000 mg | ORAL_TABLET | Freq: Every day | ORAL | 1 refills | Status: AC

## 2023-12-12 ENCOUNTER — Ambulatory Visit: Admitting: Family Medicine

## 2023-12-12 ENCOUNTER — Other Ambulatory Visit: Payer: Self-pay | Admitting: Family Medicine

## 2023-12-12 ENCOUNTER — Encounter: Payer: Self-pay | Admitting: Family Medicine

## 2023-12-12 VITALS — BP 160/90 | HR 78 | Temp 98.0°F | Ht 65.0 in | Wt 173.4 lb

## 2023-12-12 DIAGNOSIS — E119 Type 2 diabetes mellitus without complications: Secondary | ICD-10-CM

## 2023-12-12 DIAGNOSIS — Z125 Encounter for screening for malignant neoplasm of prostate: Secondary | ICD-10-CM

## 2023-12-12 DIAGNOSIS — I10 Essential (primary) hypertension: Secondary | ICD-10-CM

## 2023-12-12 DIAGNOSIS — N401 Enlarged prostate with lower urinary tract symptoms: Secondary | ICD-10-CM

## 2023-12-12 MED ORDER — TIZANIDINE HCL 4 MG PO TABS: 4.0000 mg | ORAL_TABLET | Freq: Four times a day (QID) | ORAL | 2 refills | Status: AC | PRN

## 2023-12-12 MED ORDER — OMEPRAZOLE 20 MG PO CPDR: DELAYED_RELEASE_CAPSULE | ORAL | 3 refills | Status: AC

## 2023-12-12 NOTE — Progress Notes (Signed)
 Subjective:    Patient ID: Cristian Austin, male    DOB: 1950-07-19, 73 y.o.   MRN: [id number]  Patient has a history of diabetes.  He is overdue for lab work to monitor his diabetes.  He is also due to check a PSA to screen for prostate cancer.  He is currently taking amlodipine  10 mg a day for hypertension.  He has not been taking hydrochlorothiazide .  He denies any chest pain shortness of breath or dyspnea on exertion.  He is due for his flu shot, shingles shot, a pneumonia shot.  He declines all of these today. Past Medical History:  Diagnosis Date   Diabetes mellitus type 2 with complications (HCC)    GERD (gastroesophageal reflux disease)    Hypertension     Current Outpatient Medications:    amLODipine  (NORVASC ) 10 MG tablet, Take 1 tablet (10 mg total) by mouth daily., Disp: 90 tablet, Rfl: 3   empagliflozin  (JARDIANCE ) 25 MG TABS tablet, Take 1 tablet (25 mg total) by mouth daily before breakfast., Disp: 30 tablet, Rfl: 3   hydrochlorothiazide  (MICROZIDE ) 12.5 MG capsule, TAKE 1 CAPSULE BY MOUTH EVERY DAY, Disp: 30 capsule, Rfl: 0   rosuvastatin  (CRESTOR ) 10 MG tablet, Take 1 tablet (10 mg total) by mouth daily., Disp: 90 tablet, Rfl: 1   sildenafil  (VIAGRA ) 100 MG tablet, TAKE 1/2 TO 1 TABLET BY MOUTH DAILY AS NEEDED, Disp: 5 tablet, Rfl: 5   tiZANidine  (ZANAFLEX ) 4 MG tablet, Take 1 tablet (4 mg total) by mouth every 6 (six) hours as needed for muscle spasms., Disp: 30 tablet, Rfl: 2   omeprazole  (PRILOSEC) 20 MG capsule, TAKE (1) CAPSULE BY MOUTH ONCE DAILY., Disp: 90 capsule, Rfl: 3    History reviewed. No pertinent surgical history. Allergies  Allergen Reactions   Asa [Aspirin]     GI upset   Social History   Socioeconomic History   Marital status: Married    Spouse name: Not on file   Number of children: Not on file   Years of education: Not on file   Highest education level: Not on file  Occupational History   Not on file  Tobacco Use   Smoking status: Former     Current packs/day: 0.00    Types: Cigarettes    Quit date: 10/12/1997    Years since quitting: 26.1   Smokeless tobacco: Never  Vaping Use   Vaping status: Never Used  Substance and Sexual Activity   Alcohol use: Yes    Comment: a beer here and there pt states   Drug use: No   Sexual activity: Not on file  Other Topics Concern   Not on file  Social History Narrative   Not on file   Social Drivers of Health   Financial Resource Strain: Not on file  Food Insecurity: Not on file  Transportation Needs: Not on file  Physical Activity: Not on file  Stress: Not on file  Social Connections: Not on file  Intimate Partner Violence: Not on file     Review of Systems  All other systems reviewed and are negative.      Objective:   Physical Exam Vitals reviewed.  Constitutional:      Appearance: He is well-developed.  Cardiovascular:     Rate and Rhythm: Normal rate and regular rhythm.     Heart sounds: Normal heart sounds. No murmur heard. Pulmonary:     Effort: Pulmonary effort is normal. No respiratory distress.     Breath  sounds: Normal breath sounds. No wheezing or rales.           Assessment & Plan:  Diabetes mellitus without complication (HCC) - Plan: Hemoglobin A1c, CBC with Differential/Platelet, Comprehensive metabolic panel with GFR, Lipid panel, Microalbumin / creatinine urine ratio  Prostate cancer screening - Plan: PSA  Benign prostatic hyperplasia with urinary frequency - Plan: PSA  Benign essential HTN Blood pressure is extremely high.  Resume hydrochlorothiazide  12.5 mg daily along with amlodipine .  Recheck blood pressure in 1 week.  Check hemoglobin A1c.  If hemoglobin A1c is greater than 6.5, I plan to add metformin .  Check urine microalbumin to creatinine ratio.  If elevated, consider starting the patient on lisinopril.  Also check fasting lipid panel.  Screen for prostate cancer with a PSA

## 2023-12-15 ENCOUNTER — Ambulatory Visit: Payer: Self-pay | Admitting: Family Medicine

## 2023-12-15 LAB — COMPREHENSIVE METABOLIC PANEL WITH GFR
AG Ratio: 1.6 (calc) (ref 1.0–2.5)
ALT: 14 U/L (ref 9–46)
AST: 15 U/L (ref 10–35)
Albumin: 4.2 g/dL (ref 3.6–5.1)
Alkaline phosphatase (APISO): 92 U/L (ref 35–144)
BUN: 13 mg/dL (ref 7–25)
CO2: 26 mmol/L (ref 20–32)
Calcium: 9.8 mg/dL (ref 8.6–10.3)
Chloride: 106 mmol/L (ref 98–110)
Creat: 0.91 mg/dL (ref 0.70–1.28)
Globulin: 2.6 g/dL (ref 1.9–3.7)
Glucose, Bld: 109 mg/dL — ABNORMAL HIGH (ref 65–99)
Potassium: 4.1 mmol/L (ref 3.5–5.3)
Sodium: 141 mmol/L (ref 135–146)
Total Bilirubin: 0.3 mg/dL (ref 0.2–1.2)
Total Protein: 6.8 g/dL (ref 6.1–8.1)
eGFR: 90 mL/min/1.73m2 (ref >=60)

## 2023-12-15 LAB — CBC WITH DIFFERENTIAL/PLATELET
Absolute Lymphocytes: 2247 {cells}/uL (ref 850–3900)
Absolute Monocytes: 623 {cells}/uL (ref 200–950)
Basophils Absolute: 41 {cells}/uL (ref 0–200)
Basophils Relative: 0.5 %
Eosinophils Absolute: 402 {cells}/uL (ref 15–500)
Eosinophils Relative: 4.9 %
HCT: 41.3 % (ref 39.4–51.1)
Hemoglobin: 13.3 g/dL (ref 13.2–17.1)
MCH: 28.2 pg (ref 27.0–33.0)
MCHC: 32.2 g/dL (ref 31.6–35.4)
MCV: 87.7 fL (ref 81.4–101.7)
MPV: 11.5 fL (ref 7.5–12.5)
Monocytes Relative: 7.6 %
Neutro Abs: 4887 {cells}/uL (ref 1500–7800)
Neutrophils Relative %: 59.6 %
Platelets: 327 Thousand/uL (ref 140–400)
RBC: 4.71 Million/uL (ref 4.20–5.80)
RDW: 13.3 % (ref 11.0–15.0)
Total Lymphocyte: 27.4 %
WBC: 8.2 Thousand/uL (ref 3.8–10.8)

## 2023-12-15 LAB — LIPID PANEL
Cholesterol: 179 mg/dL (ref <200)
HDL: 44 mg/dL (ref >=40)
LDL Cholesterol (Calc): 96 mg/dL
Non-HDL Cholesterol (Calc): 135 mg/dL — ABNORMAL HIGH (ref <130)
Total CHOL/HDL Ratio: 4.1 (calc) (ref <5.0)
Triglycerides: 296 mg/dL — ABNORMAL HIGH (ref <150)

## 2023-12-15 LAB — HEMOGLOBIN A1C
Hgb A1c MFr Bld: 6.5 % — ABNORMAL HIGH (ref <5.7)
Mean Plasma Glucose: 140 mg/dL
eAG (mmol/L): 7.7 mmol/L

## 2023-12-15 LAB — PSA: PSA: 0.58 ng/mL (ref <=4.00)

## 2023-12-15 LAB — MICROALBUMIN / CREATININE URINE RATIO

## 2023-12-16 LAB — MICROALBUMIN / CREATININE URINE RATIO
Creatinine, Urine: 95 mg/dL
Microalb Creat Ratio: 13 mg/g{creat} (ref <30)
Microalb, Ur: 1.2 mg/dL

## 2023-12-16 LAB — HOUSE ACCOUNT TRACKING

## 2023-12-17 ENCOUNTER — Other Ambulatory Visit: Payer: Self-pay

## 2023-12-17 DIAGNOSIS — I10 Essential (primary) hypertension: Secondary | ICD-10-CM

## 2023-12-17 MED ORDER — LOSARTAN POTASSIUM 100 MG PO TABS: 100.0000 mg | ORAL_TABLET | Freq: Every day | ORAL | 1 refills | Status: AC

## 2024-01-02 ENCOUNTER — Other Ambulatory Visit: Payer: Self-pay

## 2024-01-02 DIAGNOSIS — I10 Essential (primary) hypertension: Secondary | ICD-10-CM

## 2024-01-02 NOTE — Telephone Encounter (Signed)
 Prescription Request  01/02/2024  LOV: 01/06/23  What is the name of the medication or equipment? hydrochlorothiazide  (MICROZIDE ) 12.5 MG capsule [[id number]]  DISCONTINUED   Have you contacted your pharmacy to request a refill? Yes   Which pharmacy would you like this sent to?  [id number] - [place], [place] - [address] AT [facility] [address] [place] [address] Phone: [phone] Fax: [phone]    Patient notified that their request is being sent to the clinical staff for review and that they should receive a response within 2 business days.   Please advise at [facility] [phone]

## 2024-01-05 NOTE — Telephone Encounter (Signed)
 Requested Prescriptions  Pending Prescriptions Disp Refills   hydrochlorothiazide  (MICROZIDE ) 12.5 MG capsule 30 capsule 0    Sig: Take by mouth daily.     Cardiovascular: Diuretics - Thiazide Failed - 01/05/2024 12:25 PM      Failed - Last BP in normal range    BP Readings from Last 1 Encounters:  12/12/23 (!) 160/90         Passed - Cr in normal range and within 180 days    Creat  Date Value Ref Range Status  12/12/2023 0.91 0.70 - 1.28 mg/dL Final   Creatinine, Urine  Date Value Ref Range Status  12/12/2023 CANCELED      Comment:    TEST NOT PERFORMED . No urine received.  Result canceled by the ancillary.          Passed - K in normal range and within 180 days    Potassium  Date Value Ref Range Status  12/12/2023 4.1 3.5 - 5.3 mmol/L Final         Passed - Na in normal range and within 180 days    Sodium  Date Value Ref Range Status  12/12/2023 141 135 - 146 mmol/L Final         Passed - Valid encounter within last 6 months    Recent Outpatient Visits           3 weeks ago Diabetes mellitus without complication [facility])   [place] [facility] Pickard, Butler DASEN, MD   12 months ago New daily persistent headache   [place] [facility] Pickard, Butler DASEN, MD   1 year ago Benign essential HTN   [place] [facility] Pickard, Butler DASEN, MD
